# Patient Record
Sex: Male | Born: 1985 | Race: Black or African American | Hispanic: No | Marital: Single | Smoking: Current every day smoker
Health system: Southern US, Community
[De-identification: ages and names within clinical notes are randomized; demographics above are authoritative.]

## PROBLEM LIST (undated history)

## (undated) HISTORY — PX: HERNIA REPAIR: SHX51

---

## 2014-09-10 ENCOUNTER — Ambulatory Visit
Admission: EM | Admit: 2014-09-10 | Discharge: 2014-09-10 | Disposition: A | Discharge status: Home / Self Care | Payer: Self-pay | Attending: Family Medicine | Admitting: Family Medicine

## 2014-09-10 ENCOUNTER — Encounter: Payer: Self-pay | Admitting: Emergency Medicine

## 2014-09-10 DIAGNOSIS — R21 Rash and other nonspecific skin eruption: Secondary | ICD-10-CM

## 2014-09-10 DIAGNOSIS — F17210 Nicotine dependence, cigarettes, uncomplicated: Secondary | ICD-10-CM | POA: Insufficient documentation

## 2014-09-10 DIAGNOSIS — H6593 Unspecified nonsuppurative otitis media, bilateral: Secondary | ICD-10-CM

## 2014-09-10 DIAGNOSIS — J0100 Acute maxillary sinusitis, unspecified: Secondary | ICD-10-CM

## 2014-09-10 DIAGNOSIS — R112 Nausea with vomiting, unspecified: Secondary | ICD-10-CM

## 2014-09-10 DIAGNOSIS — J039 Acute tonsillitis, unspecified: Secondary | ICD-10-CM

## 2014-09-10 DIAGNOSIS — Z79899 Other long term (current) drug therapy: Secondary | ICD-10-CM | POA: Insufficient documentation

## 2014-09-10 LAB — CBC WITH DIFFERENTIAL/PLATELET
Basophils Absolute: 0 10*3/uL (ref 0–0.1)
Basophils Relative: 0 %
Eosinophils Absolute: 0 10*3/uL (ref 0–0.7)
Eosinophils Relative: 0 %
HCT: 41.3 % (ref 40.0–52.0)
Hemoglobin: 14 g/dL (ref 13.0–18.0)
Lymphocytes Relative: 4 %
Lymphs Abs: 0.8 10*3/uL — ABNORMAL LOW (ref 1.0–3.6)
MCH: 32 pg (ref 26.0–34.0)
MCHC: 34 g/dL (ref 32.0–36.0)
MCV: 94.2 fL (ref 80.0–100.0)
Monocytes Absolute: 3 10*3/uL — ABNORMAL HIGH (ref 0.2–1.0)
Monocytes Relative: 14 %
Neutro Abs: 18.5 10*3/uL — ABNORMAL HIGH (ref 1.4–6.5)
Neutrophils Relative %: 82 %
Platelets: 253 10*3/uL (ref 150–440)
RBC: 4.38 MIL/uL — ABNORMAL LOW (ref 4.40–5.90)
RDW: 12.8 % (ref 11.5–14.5)
WBC: 22.4 10*3/uL — ABNORMAL HIGH (ref 3.8–10.6)

## 2014-09-10 LAB — MONONUCLEOSIS SCREEN: Mono Screen: NEGATIVE

## 2014-09-10 LAB — RAPID STREP SCREEN (MED CTR MEBANE ONLY): STREPTOCOCCUS, GROUP A SCREEN (DIRECT): NEGATIVE

## 2014-09-10 MED ORDER — SALINE SPRAY 0.65 % NA SOLN: 2.0000 | NASAL | Status: DC

## 2014-09-10 MED ORDER — PENICILLIN G BENZATHINE 1200000 UNIT/2ML IM SUSP
1.2000 10*6.[IU] | Freq: Once | INTRAMUSCULAR | Status: AC
  Administered 2014-09-10: 1.2 10*6.[IU] via INTRAMUSCULAR

## 2014-09-10 MED ORDER — FLUTICASONE PROPIONATE 50 MCG/ACT NA SUSP: 1.0000 | Freq: Two times a day (BID) | NASAL | Status: DC

## 2014-09-10 MED ORDER — ONDANSETRON 8 MG PO TBDP: 8.0000 mg | ORAL_TABLET | Freq: Two times a day (BID) | ORAL | Status: DC

## 2014-09-10 MED ORDER — ACETAMINOPHEN 500 MG PO TABS: 1000.0000 mg | ORAL_TABLET | Freq: Once | ORAL | Status: AC

## 2014-09-10 MED ORDER — METHYLPREDNISOLONE SODIUM SUCC 125 MG IJ SOLR
125.0000 mg | Freq: Once | INTRAMUSCULAR | Status: AC
  Administered 2014-09-10: 125 mg via INTRAMUSCULAR

## 2014-09-10 MED ORDER — ACETAMINOPHEN 500 MG PO TABS
1000.0000 mg | ORAL_TABLET | Freq: Once | ORAL | Status: AC
  Administered 2014-09-10: 1000 mg via ORAL

## 2014-09-10 MED ORDER — ONDANSETRON 8 MG PO TBDP
8.0000 mg | ORAL_TABLET | Freq: Once | ORAL | Status: AC
  Administered 2014-09-10: 8 mg via ORAL

## 2014-09-10 NOTE — Discharge Instructions (Signed)
Tonsillitis Tonsillitis is an infection of the throat that causes the tonsils to become red, tender, and swollen. Tonsils are collections of lymphoid tissue at the back of the throat. Each tonsil has crevices (crypts). Tonsils help fight nose and throat infections and keep infection from spreading to other parts of the body for the first 18 months of life.  CAUSES Sudden (acute) tonsillitis is usually caused by infection with streptococcal bacteria. Long-lasting (chronic) tonsillitis occurs when the crypts of the tonsils become filled with pieces of food and bacteria, which makes it easy for the tonsils to become repeatedly infected. SYMPTOMS  Symptoms of tonsillitis include:  A sore throat, with possible difficulty swallowing.  White patches on the tonsils. Fever.  Sinusitis Sinusitis is redness, soreness, and inflammation of the paranasal sinuses. Paranasal sinuses are air pockets within the bones of your face (beneath the eyes, the middle of the forehead, or above the eyes). In healthy paranasal sinuses, mucus is able to drain out, and air is able to circulate through them by way of your nose. However, when your paranasal sinuses are inflamed, mucus and air can become trapped. This can allow bacteria and other germs to grow and cause infection. Sinusitis can develop quickly and last only a short time (acute) or continue over a long period (chronic). Sinusitis that lasts for more than 12 weeks is considered chronic.  CAUSES  Causes of sinusitis include:  Allergies.  Structural abnormalities, such as displacement of the cartilage that separates your nostrils (deviated septum), which can decrease the air flow through your nose and sinuses and affect sinus drainage.  Functional abnormalities, such as when the small hairs (cilia) that line your sinuses and help remove mucus do not work properly or are not present. SIGNS AND SYMPTOMS  Symptoms of acute and chronic sinusitis are the same. The  primary symptoms are pain and pressure around the affected sinuses. Other symptoms include:  Upper toothache.  Earache.  Headache.  Bad breath.  Decreased sense of smell and taste.  A cough, which worsens when you are lying flat.  Fatigue.  Fever.  Thick drainage from your nose, which often is green and may contain pus (purulent).  Swelling and warmth over the affected sinuses. DIAGNOSIS  Your health care provider will perform a physical exam. During the exam, your health care provider may:  Look in your nose for signs of abnormal growths in your nostrils (nasal polyps).  Tap over the affected sinus to check for signs of infection.  View the inside of your sinuses (endoscopy) using an imaging device that has a light attached (endoscope). If your health care provider suspects that you have chronic sinusitis, one or more of the following tests may be recommended:  Allergy tests.  Nasal culture. A sample of mucus is taken from your nose, sent to a lab, and screened for bacteria.  Nasal cytology. A sample of mucus is taken from your nose and examined by your health care provider to determine if your sinusitis is related to an allergy. TREATMENT  Most cases of acute sinusitis are related to a viral infection and will resolve on their own within 10 days. Sometimes medicines are prescribed to help relieve symptoms (pain medicine, decongestants, nasal steroid sprays, or saline sprays).  However, for sinusitis related to a bacterial infection, your health care provider will prescribe antibiotic medicines. These are medicines that will help kill the bacteria causing the infection.  Rarely, sinusitis is caused by a fungal infection. In theses cases, your  health care provider will prescribe antifungal medicine. For some cases of chronic sinusitis, surgery is needed. Generally, these are cases in which sinusitis recurs more than 3 times per year, despite other treatments. HOME CARE  INSTRUCTIONS   Drink plenty of water. Water helps thin the mucus so your sinuses can drain more easily.  Use a humidifier.  Inhale steam 3 to 4 times a day (for example, sit in the bathroom with the shower running).  Apply a warm, moist washcloth to your face 3 to 4 times a day, or as directed by your health care provider.  Use saline nasal sprays to help moisten and clean your sinuses.  Take medicines only as directed by your health care provider.  If you were prescribed either an antibiotic or antifungal medicine, finish it all even if you start to feel better. SEEK IMMEDIATE MEDICAL CARE IF:  You have increasing pain or severe headaches.  You have nausea, vomiting, or drowsiness.  You have swelling around your face.  You have vision problems.  You have a stiff neck.  You have difficulty breathing. MAKE SURE YOU:   Understand these instructions.  Will watch your condition.  Will get help right away if you are not doing well or get worse. Document Released: 01/26/2005 Document Revised: 06/12/2013 Document Reviewed: 02/10/2011 [facility] Patient Information 2015 [place], [place]. This information is not intended to replace advice given to you by your health care provider. Make sure you discuss any questions you have with your health care provider.  Tiredness.  New episodes of snoring during sleep, when you did not snore before.  Small, foul-smelling, yellowish-white pieces of material (tonsilloliths) that you occasionally cough up or spit out. The tonsilloliths can also cause you to have bad breath. DIAGNOSIS Tonsillitis can be diagnosed through a physical exam. Diagnosis can be confirmed with the results of lab tests, including a throat culture. TREATMENT  The goals of tonsillitis treatment include the reduction of the severity and duration of symptoms and prevention of associated conditions. Symptoms of tonsillitis can be improved with the use of steroids to reduce the  swelling. Tonsillitis caused by bacteria can be treated with antibiotic medicines. Usually, treatment with antibiotic medicines is started before the cause of the tonsillitis is known. However, if it is determined that the cause is not bacterial, antibiotic medicines will not treat the tonsillitis. If attacks of tonsillitis are severe and frequent, your health care provider may recommend surgery to remove the tonsils (tonsillectomy). HOME CARE INSTRUCTIONS   Rest as much as possible and get plenty of sleep.  Drink plenty of fluids. While the throat is very sore, eat soft foods or liquids, such as sherbet, soups, or instant breakfast drinks.  Eat frozen ice pops.  Gargle with a warm or cold liquid to help soothe the throat. Mix 1/4 teaspoon of salt and 1/4 teaspoon of baking soda in 8 oz of water. SEEK MEDICAL CARE IF:   Large, tender lumps develop in your neck.  A rash develops.  A green, yellow-brown, or bloody substance is coughed up.  You are unable to swallow liquids or food for 24 hours.  You notice that only one of the tonsils is swollen. SEEK IMMEDIATE MEDICAL CARE IF:   You develop any new symptoms such as vomiting, severe headache, stiff neck, chest pain, or trouble breathing or swallowing.  You have severe throat pain along with drooling or voice changes.  You have severe pain, unrelieved with recommended medications.  You are unable to  fully open the mouth.  You develop redness, swelling, or severe pain anywhere in the neck.  You have a fever. MAKE SURE YOU:   Understand these instructions.  Will watch your condition.  Will get help right away if you are not doing well or get worse. Document Released: 11/05/2004 Document Revised: 06/12/2013 Document Reviewed: 07/15/2012 [facility] Patient Information 2015 [place], [place]. This information is not intended to replace advice given to you by your health care provider. Make sure you discuss any questions you have with  your health care provider. Otitis Media With Effusion Otitis media with effusion is the presence of fluid in the middle ear. This is a common problem in children, which often follows ear infections. It may be present for weeks or longer after the infection. Unlike an acute ear infection, otitis media with effusion refers only to fluid behind the ear drum and not infection. Children with repeated ear and sinus infections and allergy problems are the most likely to get otitis media with effusion. CAUSES  The most frequent cause of the fluid buildup is dysfunction of the eustachian tubes. These are the tubes that drain fluid in the ears to the back of the nose (nasopharynx). SYMPTOMS   The main symptom of this condition is hearing loss. As a result, you or your child may:  Listen to the TV at a loud volume.  Not respond to questions.  Ask "what" often when spoken to.  Mistake or confuse one sound or word for another.  There may be a sensation of fullness or pressure but usually not pain. DIAGNOSIS   Your health care provider will diagnose this condition by examining you or your child's ears.  Your health care provider may test the pressure in you or your child's ear with a tympanometer.  A hearing test may be conducted if the problem persists. TREATMENT   Treatment depends on the duration and the effects of the effusion.  Antibiotics, decongestants, nose drops, and cortisone-type drugs (tablets or nasal spray) may not be helpful.  Children with persistent ear effusions may have delayed language or behavioral problems. Children at risk for developmental delays in hearing, learning, and speech may require referral to a specialist earlier than children not at risk.  You or your child's health care provider may suggest a referral to an ear, nose, and throat surgeon for treatment. The following may help restore normal hearing:  Drainage of fluid.  Placement of ear tubes (tympanostomy  tubes).  Removal of adenoids (adenoidectomy). HOME CARE INSTRUCTIONS   Avoid secondhand smoke.  Infants who are breastfed are less likely to have this condition.  Avoid feeding infants while they are lying flat.  Avoid known environmental allergens.  Avoid people who are sick. SEEK MEDICAL CARE IF:   Hearing is not better in 3 months.  Hearing is worse.  Ear pain.  Drainage from the ear.  Dizziness. MAKE SURE YOU:   Understand these instructions.  Will watch your condition.  Will get help right away if you are not doing well or get worse. Document Released: 03/05/2004 Document Revised: 06/12/2013 Document Reviewed: 08/23/2012 [facility] Patient Information 2015 [place], [place]. This information is not intended to replace advice given to you by your health care provider. Make sure you discuss any questions you have with your health care provider. Folliculitis  Folliculitis is redness, soreness, and swelling (inflammation) of the hair follicles. This condition can occur anywhere on the body. People with weakened immune systems, diabetes, or obesity have a  greater risk of getting folliculitis. CAUSES  Bacterial infection. This is the most common cause.  Fungal infection.  Viral infection.  Contact with certain chemicals, especially oils and tars. Long-term folliculitis can result from bacteria that live in the nostrils. The bacteria may trigger multiple outbreaks of folliculitis over time. SYMPTOMS Folliculitis most commonly occurs on the scalp, thighs, legs, back, buttocks, and areas where hair is shaved frequently. An early sign of folliculitis is a small, white or yellow, pus-filled, itchy lesion (pustule). These lesions appear on a red, inflamed follicle. They are usually less than 0.2 inches (5 mm) wide. When there is an infection of the follicle that goes deeper, it becomes a boil or furuncle. A group of closely packed boils creates a larger lesion (carbuncle).  Carbuncles tend to occur in hairy, sweaty areas of the body. DIAGNOSIS  Your caregiver can usually tell what is wrong by doing a physical exam. A sample may be taken from one of the lesions and tested in a lab. This can help determine what is causing your folliculitis. TREATMENT  Treatment may include:  Applying warm compresses to the affected areas.  Taking antibiotic medicines orally or applying them to the skin.  Draining the lesions if they contain a large amount of pus or fluid.  Laser hair removal for cases of long-lasting folliculitis. This helps to prevent regrowth of the hair. HOME CARE INSTRUCTIONS  Apply warm compresses to the affected areas as directed by your caregiver.  If antibiotics are prescribed, take them as directed. Finish them even if you start to feel better.  You may take over-the-counter medicines to relieve itching.  Do not shave irritated skin.  Follow up with your caregiver as directed. SEEK IMMEDIATE MEDICAL CARE IF:   You have increasing redness, swelling, or pain in the affected area.  You have a fever. MAKE SURE YOU:  Understand these instructions.  Will watch your condition.  Will get help right away if you are not doing well or get worse. Document Released: 04/06/2001 Document Revised: 07/28/2011 Document Reviewed: 04/28/2011 [facility] Patient Information 2015 [place], [place]. This information is not intended to replace advice given to you by your health care provider. Make sure you discuss any questions you have with your health care provider. Nausea and Vomiting Nausea is a sick feeling that often comes before throwing up (vomiting). Vomiting is a reflex where stomach contents come out of your mouth. Vomiting can cause severe loss of body fluids (dehydration). Children and elderly adults can become dehydrated quickly, especially if they also have diarrhea. Nausea and vomiting are symptoms of a condition or disease. It is important to find the  cause of your symptoms. CAUSES   Direct irritation of the stomach lining. This irritation can result from increased acid production (gastroesophageal reflux disease), infection, food poisoning, taking certain medicines (such as nonsteroidal anti-inflammatory drugs), alcohol use, or tobacco use.  Signals from the brain.These signals could be caused by a headache, heat exposure, an inner ear disturbance, increased pressure in the brain from injury, infection, a tumor, or a concussion, pain, emotional stimulus, or metabolic problems.  An obstruction in the gastrointestinal tract (bowel obstruction).  Illnesses such as diabetes, hepatitis, gallbladder problems, appendicitis, kidney problems, cancer, sepsis, atypical symptoms of a heart attack, or eating disorders.  Medical treatments such as chemotherapy and radiation.  Receiving medicine that makes you sleep (general anesthetic) during surgery. DIAGNOSIS Your caregiver may ask for tests to be done if the problems do not improve after a  few days. Tests may also be done if symptoms are severe or if the reason for the nausea and vomiting is not clear. Tests may include:  Urine tests.  Blood tests.  Stool tests.  Cultures (to look for evidence of infection).  X-rays or other imaging studies. Test results can help your caregiver make decisions about treatment or the need for additional tests. TREATMENT You need to stay well hydrated. Drink frequently but in small amounts.You may wish to drink water, sports drinks, clear broth, or eat frozen ice pops or gelatin dessert to help stay hydrated.When you eat, eating slowly may help prevent nausea.There are also some antinausea medicines that may help prevent nausea. HOME CARE INSTRUCTIONS   Take all medicine as directed by your caregiver.  If you do not have an appetite, do not force yourself to eat. However, you must continue to drink fluids.  If you have an appetite, eat a normal diet  unless your caregiver tells you differently.  Eat a variety of complex carbohydrates (rice, wheat, potatoes, bread), lean meats, yogurt, fruits, and vegetables.  Avoid high-fat foods because they are more difficult to digest.  Drink enough water and fluids to keep your urine clear or pale yellow.  If you are dehydrated, ask your caregiver for specific rehydration instructions. Signs of dehydration may include:  Severe thirst.  Dry lips and mouth.  Dizziness.  Dark urine.  Decreasing urine frequency and amount.  Confusion.  Rapid breathing or pulse. SEEK IMMEDIATE MEDICAL CARE IF:   You have blood or brown flecks (like coffee grounds) in your vomit.  You have black or bloody stools.  You have a severe headache or stiff neck.  You are confused.  You have severe abdominal pain.  You have chest pain or trouble breathing.  You do not urinate at least once every 8 hours.  You develop cold or clammy skin.  You continue to vomit for longer than 24 to 48 hours.  You have a fever. MAKE SURE YOU:   Understand these instructions.  Will watch your condition.  Will get help right away if you are not doing well or get worse. Document Released: 01/26/2005 Document Revised: 04/20/2011 Document Reviewed: 06/25/2010 [facility] Patient Information 2015 [place], [place]. This information is not intended to replace advice given to you by your health care provider. Make sure you discuss any questions you have with your health care provider.

## 2014-09-10 NOTE — ED Provider Notes (Addendum)
CSN: [id number]     Arrival date & time 09/10/14  1007 History   First MD Initiated Contact with Patient 09/10/14 1042     Chief Complaint  Patient presents with  . Sore Throat   (Consider location/radiation/quality/duration/timing/severity/associated sxs/prior Treatment) HPI Comments: Single caucasian male here with mother recently relocated from [place] working at [employer] denied sick contacts.  Sore throat/cough.  Vomited x 1 in registration today.  Didn't eat breakfast had OTC cough and cold medication liquid red colored this am.  Headache entire head with cough productive white phlegm.  Belly pain with vomiting. Denied diarrhea/constipation/blood in stool.  Has noticed rash on abdomen for months reported blisters dry no itching, body aches, last peed this am yellow.  The history is provided by the patient and a parent.    History reviewed. No pertinent past medical history. Past Surgical History  Procedure Laterality Date  . Hernia repair     History reviewed. No pertinent family history. History  Substance Use Topics  . Smoking status: Current Every Day Smoker -- 0.50 packs/day    Types: Cigarettes  . Smokeless tobacco: Never Used  . Alcohol Use: Yes    Review of Systems  Constitutional: Positive for fever and fatigue. Negative for chills, diaphoresis, activity change and appetite change.  HENT: Positive for congestion and sore throat. Negative for dental problem, drooling, ear discharge, ear pain, facial swelling, hearing loss, mouth sores, nosebleeds, postnasal drip, rhinorrhea, sinus pressure, sneezing, tinnitus, trouble swallowing and voice change.   Eyes: Negative for photophobia, pain, discharge, redness, itching and visual disturbance.  Respiratory: Positive for cough. Negative for choking, shortness of breath and wheezing.   Cardiovascular: Negative for chest pain, palpitations and leg swelling.  Gastrointestinal: Positive for vomiting and  abdominal pain. Negative for nausea, diarrhea, constipation, blood in stool and abdominal distention.  Endocrine: Negative for cold intolerance and heat intolerance.  Genitourinary: Negative for dysuria and hematuria.  Musculoskeletal: Positive for myalgias. Negative for back pain, joint swelling, arthralgias, gait problem, neck pain and neck stiffness.  Skin: Positive for rash. Negative for color change, pallor and wound.  Allergic/Immunologic: Negative for environmental allergies and food allergies.  Neurological: Negative for dizziness, tremors, seizures, syncope, facial asymmetry, speech difficulty, weakness, light-headedness and headaches.  Hematological: Does not bruise/bleed easily.  Psychiatric/Behavioral: Negative for behavioral problems, confusion, sleep disturbance and agitation.    Allergies  Review of patient's allergies indicates no known allergies.  Home Medications   Prior to Admission medications   Medication Sig Start Date End Date Taking? Authorizing Provider  acetaminophen (TYLENOL) 500 MG tablet Take 2 tablets (1,000 mg total) by mouth once. 09/10/14   Barbaraann Barthel, NP  fluticasone (FLONASE) 50 MCG/ACT nasal spray Place 1 spray into both nostrils 2 (two) times daily. 09/10/14   Barbaraann Barthel, NP  ondansetron (ZOFRAN-ODT) 8 MG disintegrating tablet Take 1 tablet (8 mg total) by mouth 2 (two) times daily. 09/10/14   Barbaraann Barthel, NP  sodium chloride (OCEAN) 0.65 % SOLN nasal spray Place 2 sprays into both nostrils every 2 (two) hours while awake. 09/10/14   Jarold Song Betancourt, NP   BP 117/71 mmHg  Pulse 95  Temp(Src) 100.9 F (38.3 C) (Oral)  Resp 18  Ht  (1.727 m)  Wt 130 lb (58.968 kg)  BMI 19.77 kg/m2  SpO2 99% Physical Exam  Constitutional: He is oriented to person, place, and time. Vital signs are normal. He appears well-developed and well-nourished. No  distress.  HENT:  Head: Normocephalic and atraumatic.  Right Ear: Hearing, external ear and ear  canal normal. A middle ear effusion is present.  Left Ear: Hearing, external ear and ear canal normal. A middle ear effusion is present.  Nose: Mucosal edema and rhinorrhea present. No nose lacerations, sinus tenderness, nasal deformity, septal deviation or nasal septal hematoma. No epistaxis.  No foreign bodies. Right sinus exhibits maxillary sinus tenderness. Right sinus exhibits no frontal sinus tenderness. Left sinus exhibits maxillary sinus tenderness. Left sinus exhibits no frontal sinus tenderness.  Mouth/Throat: Uvula is midline and mucous membranes are normal. Mucous membranes are not pale, not dry and not cyanotic. He does not have dentures. No oral lesions. No trismus in the jaw. Abnormal dentition. No dental abscesses, uvula swelling, lacerations or dental caries. Posterior oropharyngeal edema and posterior oropharyngeal erythema present. No oropharyngeal exudate or tonsillar abscesses.  Cracked teeth; exudate bilateral tonsils symmetrical enlarged 2-3+/4 bilaterally with erythema extending to soft and hard palate; cobblestoning posterior pharynx; bilateral TMs with air fluid level no opacity  Eyes: Conjunctivae, EOM and lids are normal. Pupils are equal, round, and reactive to light. Right eye exhibits no discharge. Left eye exhibits no discharge. No scleral icterus.  Neck: Trachea normal, normal range of motion and phonation normal. Neck supple. No tracheal tenderness, no spinous process tenderness and no muscular tenderness present. No rigidity. No tracheal deviation, no edema, no erythema and normal range of motion present.  Cardiovascular: Normal rate, regular rhythm, normal heart sounds and intact distal pulses.  Exam reveals no gallop and no friction rub.   No murmur heard. Pulmonary/Chest: Effort normal. No accessory muscle usage or stridor. No respiratory distress. He has no decreased breath sounds. He has no wheezes. He has no rhonchi. He has no rales. He exhibits no tenderness.    Abdominal: Soft. Bowel sounds are normal. He exhibits no shifting dullness, no distension, no pulsatile liver, no fluid wave, no abdominal bruit, no ascites, no pulsatile midline mass and no mass. There is no hepatosplenomegaly. There is no tenderness. There is no rigidity, no rebound, no guarding, no CVA tenderness, no tenderness at McBurney's point and negative Murphy's sign. Hernia confirmed negative in the ventral area.  Musculoskeletal: He exhibits no edema or tenderness.  Lymphadenopathy:    He has no cervical adenopathy.  Neurological: He is alert and oriented to person, place, and time. He has normal strength. He displays no atrophy and no tremor. No cranial nerve deficit or sensory deficit. He exhibits normal muscle tone. He displays no seizure activity. Coordination and gait normal. GCS eye subscore is 4. GCS verbal subscore is 5. GCS motor subscore is 6.  Skin: Skin is warm, dry and intact. Rash noted. No abrasion, no bruising, no burn, no ecchymosis, no laceration, no lesion, no petechiae and no purpura noted. Rash is papular. Rash is not macular, not maculopapular, not nodular, not pustular, not vesicular and not urticarial. He is not diaphoretic. No cyanosis or erythema. No pallor. Nails show no clubbing.     Fine papular rash abdomen not TTP dry no discoloration e.g. Hyper/hypopigmentation or erythema  Psychiatric: He has a normal mood and affect. His speech is normal and behavior is normal. Judgment and thought content normal. Cognition and memory are normal.  Nursing note and vitals reviewed.   ED Course  Procedures (including critical care time) Labs Review Labs Reviewed  CBC WITH DIFFERENTIAL/PLATELET - Abnormal; Notable for the following:    WBC 22.4 (*)    RBC  4.38 (*)    Neutro Abs 18.5 (*)    Lymphs Abs 0.8 (*)    Monocytes Absolute 3.0 (*)    All other components within normal limits  RAPID STREP SCREEN (NOT AT [facility])  CULTURE, GROUP A STREP ([facility] ONLY)   MONONUCLEOSIS SCREEN   Medications  ondansetron (ZOFRAN-ODT) disintegrating tablet 8 mg (8 mg Oral Given 09/10/14 1120)  methylPREDNISolone sodium succinate (SOLU-MEDROL) 125 mg/2 mL injection 125 mg (125 mg Intramuscular Given 09/10/14 1157)  acetaminophen (TYLENOL) tablet 1,000 mg (1,000 mg Oral Given 09/10/14 1202)  penicillin g benzathine (BICILLIN LA) 1200000 UNIT/31ML injection 1.2 Million Units (1.2 Million Units Intramuscular Given 09/10/14 1213)   Filed Vitals:   09/10/14 1247  BP: 117/71  Pulse: 95  Temp: 100.9 F (38.3 C)  Resp: 18    Imaging Review No results found.  1115 zofran 0DT 8mg  po ordered for patient.  Lying on exam table awaiting rapid strep, CBC and mononucleosis rapid testing.  Discussed differential diagnoses of tonsillar exudate with patient and mother e.g. Viral, bacterial etiologies.  Throat culture for atypical strep.  Signs and symptoms of tonsillar abscess requiring immediate follow up in ER.  Work note x 24 hours given to patient.  Patient prefers IM injections for medications if possible.  Patient and mother verbalized understanding of information/instructions, agreed with plan of care and had no further questions at this time.  1140 rapid strep notified patient and mother solumedrol IM ordered for patient.  Mother and patient verbalized understanding of information, agreed with plan of care and had no further questions at this time.  1215 patient notified mono rapid negative.  Bicillin IM 1.31M units ordered.  Patient also received tylenol 1000mg  po x 1now for worsening fever able to tolerate po fluids after zofran ODT.  Patient verbalized understanding of information and had no further questions at this time.  1235 patient tolerated one cup of water in clinic.  Reiterated if worsening especially asymetrical tonsils, dyspnea, dysphagia follow up ER tonight.  Tylenol helped some with pain.  Mother to drive patient home.  Patient verbalized understanding of  information/instructions, agreed with plan of care and had no further questions at this time. MDM   1. Rash   2. Tonsillitis with exudate   3. Acute maxillary sinusitis, recurrence not specified   4. Otitis media with effusion, bilateral   5. Non-intractable vomiting with nausea, vomiting of unspecified type    Abdominal hair folliculitis suspected as papular non erythematous at base of hair.  May apply OTC hydrocortisone 1% cream sparingly to affected hair follicles. Wash hands thoroughly after application.    Exitcare handout on folliculitis given to patient.  Patient verbalized understanding of information, instructions, agreed with plan of care and had no further questions at this time.  Rapid strep and mono rapid negative. Rest, hydrate, hydrate, hydrate today.  Throat culture pending in 48 hours typically will call once results available discussed with patient to contact clinic Wednesday 3 Aug for results since I will not be working that day and ask to speak with nurse.   Gave bicillin LA and solumedrol IM in clinic today as prefers to oral medications.  School/work excuse note given to patient for 24 hours.  Usually no specific medical treatment is needed if a virus is causing the sore throat.  The throat most often gets better on its own within 5 to 7 days.  Antibiotic medicine does not cure viral pharyngitis.   Rx for tylenol 1000mg  po QID  prn pain.  Discussed if dysphagia, dyspnea, asymmetrical tonsil enlargement to follow up immediately for re-evaluation.  Discussed risk of tonsillar abscess with mother and patient. For acute pharyngitis caused by bacteria, your healthcare provider will prescribe an antibiotic.  Marland Kitchen Do not smoke.  Marland Kitchen Avoid secondhand smoke and other air pollutants.  . Use a cool mist humidifier to add moisture to the air.  . Get plenty of rest.  . You may want to rest your throat by talking less and eating a diet that is mostly liquid or soft for a day or two.    Marland Kitchen Nonprescription throat lozenges and mouthwashes should help relieve the soreness.   . Gargling with warm saltwater and drinking warm liquids may help.  (You can make a saltwater solution by adding 1/4 teaspoon of salt to 8 ounces, or 240 mL, of warm water.)  . A nonprescription pain reliever such as aspirin, acetaminophen, or ibuprofen may ease general aches and pains.   FOLLOW UP with clinic provider if no improvements in the next 7-10 days. exitcare handout on tonsillitis, pharyngitis given to patient.  Patient and mother verbalized understanding of instructions and agreed with plan of care. P2:  Hand washing and diet.  Start flonase 1 spray each nostril BID.  No evidence of systemic bacterial infection, non toxic and well hydrated.  I do not see where any further testing or imaging is necessary at this time.   I will suggest supportive care, rest, good hygiene and encourage the patient to take adequate fluids.  The patient is to return to clinic or EMERGENCY ROOM if symptoms worsen or change significantly.  Exitcare handout on sinusitis given to patient.  Patient verbalized agreement and understanding of treatment plan and had no further questions at this time.   P2:  Hand washing and cover cough  Work/school note given to patient for 24 hours off.  I have recommended clear fluids and advance diet as tolerated soft avoid fried, spicy and dairy until symptoms completely resolve.  Exitcare handout on nausea and vomiting given to patient. zofran 8mg  ODT po BID prn Rx given to patient.  Discussed may try ginger ale.  Return to the clinic if  symptoms persist or worsen; I have alerted the patient to call if high fever, dehydration, marked weakness, fainting, increased abdominal pain, blood in stool or vomit (red or black).  Patient verbalized agreement and understanding of treatment plan and had no further questions at this time.   P2:  Hand washing and fitness Supportive treatment.   No evidence of  invasive bacterial infection, non toxic and well hydrated.  This is most likely self limiting viral infection.  I do not see where any further testing or imaging is necessary at this time.   I will suggest supportive care, rest, good hygiene and encourage the patient to take adequate fluids.  The patient is to return to clinic or EMERGENCY ROOM if symptoms worsen or change significantly e.g. ear pain, fever, purulent discharge from ears or bleeding.  Exitcare handout on otitis media with effusion given to patient.  Patient verbalized agreement and understanding of treatment plan.    Barbaraann Barthel, NP 09/10/14 1858  Telephone message left for patient throat culture Group G strep.  Received bicillin and solumedrol IM in clinic.  Want to verify if symptoms have resolved.  Left message for patient to contact clinic with status update.  Barbaraann Barthel, NP 09/14/14 [phone]

## 2014-09-10 NOTE — ED Notes (Signed)
Patient c/o sore throat, HAs, and vomiting that started 2 days ago.

## 2014-09-10 NOTE — ED Notes (Signed)
Pt provided warm blanket and gingerale.

## 2014-09-11 LAB — CULTURE, GROUP A STREP (THRC)

## 2014-09-15 ENCOUNTER — Ambulatory Visit
Admission: EM | Admit: 2014-09-15 | Discharge: 2014-09-15 | Disposition: A | Discharge status: Home / Self Care | Payer: Self-pay | Attending: Internal Medicine | Admitting: Internal Medicine

## 2014-09-15 DIAGNOSIS — J36 Peritonsillar abscess: Secondary | ICD-10-CM

## 2014-09-15 MED ORDER — CEFTRIAXONE SODIUM 1 G IJ SOLR
1.0000 g | Freq: Once | INTRAMUSCULAR | Status: AC
  Administered 2014-09-15: 1 g via INTRAMUSCULAR

## 2014-09-15 MED ORDER — PENICILLIN V POTASSIUM 500 MG PO TABS: ORAL_TABLET | ORAL | Status: DC

## 2014-09-15 MED ORDER — PREDNISONE 20 MG PO TABS: ORAL_TABLET | ORAL | Status: DC

## 2014-09-15 NOTE — Discharge Instructions (Signed)
The left side of your throat/tonsils is still swollen. This may represent the swelling from the  strep throat is just taking a longer time to go away or it may be that you are developing an abscess in that area. As we all discussed in the exam room - please be aware if you start having trouble swallowing, if you have trouble making your spit swallow, you have increased pain or your fever returns and won't be controlled by tylenol or ibuprofen-   you need to go to the EMERGENCY ROOM of your choice...  The injection you got while here was Rocephin, an antibiotic The prescription I am giving youis additional antibiotic Penicillin VK and the small pills are Prednisone which should help decrease the swelling in the tissue All of these working together should make the next week steadily better.  If things turn worse please pay attention and seek additional care.  We need you to call an ENT ( Ear , Nose, Throat doctor) to schedule a visit for early next week to be re-evalauted- I have given you a business card for one of the doctors upstairs from [place]  Peritonsillar Abscess A peritonsillar abscess is a collection of pus located in the back of the throat behind the tonsils. It usually occurs when a streptococcal infection of the throat or tonsils spreads into the space around the tonsils. They are almost always caused by the streptococcal germ (bacteria). The treatment of a peritonsillar abscess is most often drainage accomplished by putting a needle into the abscess or cutting (incising) and draining the abscess. This is most often followed with a course of antibiotics. HOME CARE INSTRUCTIONS  If your abscess was drained by your caregiver today, rinse your throat (gargle) with warm salt water four times per day or as needed for comfort. Do not swallow this mixture. Mix 1 teaspoon of salt in 8 ounces of warm water for gargling.  Rest in bed as needed. Resume activities as able.  Apply cold to your neck for  pain relief. Fill a plastic bag with ice and wrap it in a towel. Hold the ice on your neck for 20 minutes 4 times per day.  Eat a soft or liquid diet as tolerated while your throat remains sore. Popsicles and ice cream may be good early choices. Drinking plenty of cold fluids will probably be soothing and help take swelling down in between the warm gargles.  Only take over-the-counter or prescription medicines for pain, discomfort, or fever as directed by your caregiver. Do not use aspirin unless directed by your physician. Aspirin slows down the clotting process. It can also cause bleeding from the drainage area if this was needled or incised today.  If antibiotics were prescribed, take them as directed for the full course of the prescription. Even if you feel you are well, you need to take them. SEEK MEDICAL CARE IF:   You have increased pain, swelling, redness, or drainage in your throat.  You develop signs of infection such as dizziness, headache, lethargy, or generalized feelings of illness.  You have difficulty breathing, swallowing or eating.  You show signs of becoming dehydrated (lightheadedness when standing, decreased urine output, a fast heart rate, or dry mouth and mucous membranes). SEEK IMMEDIATE MEDICAL CARE IF:   You have a fever.  You are coughing up or vomiting blood.  You develop more severe throat pain uncontrolled with medicines or you start to drool.  You develop difficulty breathing, talking, or find it easier  to breathe while leaning forward. Document Released: 01/26/2005 Document Revised: 04/20/2011 Document Reviewed: 09/09/2007 [facility] Patient Information 2015 [place], [place]. This information is not intended to replace advice given to you by your health care provider. Make sure you discuss any questions you have with your health care provider.

## 2014-09-15 NOTE — ED Notes (Signed)
Patient with sore throat since last Monday. Was treated but still hurts and feels swollen.

## 2014-09-18 NOTE — ED Provider Notes (Signed)
CSN: [id number]     Arrival date & time 09/15/14  0808 History   First MD Initiated Contact with Patient 09/15/14 [phone]     Chief Complaint  Patient presents with  . Sore Throat   (Consider location/radiation/quality/duration/timing/severity/associated sxs/prior Treatment) HPI  29 yo AAM presents with continued sore throat. Was seen 8/1 with tonsillitis, sinusitis, Bilat OM that screened negative to Strep and Mono. Treated with Bicillin and SoluMedrol. Strep culture final positive for Strep G, patient notified yesterday. Presents today with continued sore throat and aggravating fullness on the left. No difficulty swallowing fluids or solids, no difficulty breathing or swallowing saliva. He takes an occassional Tylenol and uses nasal spray daily History reviewed. No pertinent past medical history. Past Surgical History  Procedure Laterality Date  . Hernia repair     History reviewed. No pertinent family history. History  Substance Use Topics  . Smoking status: Current Every Day Smoker -- 0.50 packs/day    Types: Cigarettes  . Smokeless tobacco: Never Used  . Alcohol Use: Yes    Review of Systems   Constitutional -afebrile Eyes-denies visual changes ENT- normal voice,has sore throat and left sided fullnes but swallows without difficulty CV-denies chest pain Resp-denies SOB GI- negative for nausea,vomiting, diarrhea- GI symptoms have resolved GU- negative for dysuria MSK- negative for back pain, ambulatory Skin- denies acute changes Neuro- negative headache,focal weakness or numbness    Allergies  Review of patient's allergies indicates no known allergies.  Home Medications   Prior to Admission medications   Medication Sig Start Date End Date Taking? Authorizing Provider  acetaminophen (TYLENOL) 500 MG tablet Take 2 tablets (1,000 mg total) by mouth once. 09/10/14   Barbaraann Barthel, NP  fluticasone (FLONASE) 50 MCG/ACT nasal spray Place 1 spray into both nostrils 2 (two)  times daily. 09/10/14   Barbaraann Barthel, NP  ondansetron (ZOFRAN-ODT) 8 MG disintegrating tablet Take 1 tablet (8 mg total) by mouth 2 (two) times daily. 09/10/14   Barbaraann Barthel, NP  penicillin v potassium (VEETID) 500 MG tablet 2 tablets twice daily  x 10 days 09/15/14   Rae Halsted, PA-C  predniSONE (DELTASONE) 20 MG tablet 3 tablets daily ( take all together)  for 5 days 09/15/14   Rae Halsted, PA-C  sodium chloride (OCEAN) 0.65 % SOLN nasal spray Place 2 sprays into both nostrils every 2 (two) hours while awake. 09/10/14   Jarold Song Betancourt, NP   BP 123/75 mmHg  Pulse 85  Temp(Src) 98.8 F (37.1 C) (Tympanic)  Resp 18  Ht 5\' 8"  (1.727 m)  Wt 130 lb (58.968 kg)  BMI 19.77 kg/m2  SpO2 98% Physical Exam   Constitutional -alert and oriented,well appearing and in mild distress sore throat-afebrile Head-atraumatic, normocephalic Eyes- conjunctiva normal, EOMI ,conjugate gaze Nose- no congestion or rhinorrhea Mouth/throat- mucous membranes moist ,oropharynx mild erythema;left tonsil is assymmetrically enlarged and there is suspicion of peritonsillar abscess; no exudate, Fullness significantly less than midline, deeply erythematous,moderate swelling, no difficulty swallowing Neck- supple with bilateral shotty  glandular enlargement, only mildly tender; no posterior nodes, FROM neck without stiffness CV- regular rate, grossly normal heart sounds,  Resp-no distress, normal respiratory effort,clear to auscultation bilaterally GI- soft,non-tender,no distention- rash is no longer present on the abdomen, he is unsure where it was GU-  not examined MSK- non tender, normal ROM, all extremities, ambulatory, self-care Neuro- normal speech and language, no gross focal neurological deficit appreciated, Skin-warm,dry ,intact; no rash noted Psych-mood and affect grossly normal;  speech and behavior grossly normal ED Course  Procedures (including critical care time) Labs Review Labs Reviewed - No data  to display  Imaging Review No results found.  MDM   1. Peritonsillar abscess   2.      Pharyngitis   Consultation with Dr Dayton Scrape. Will treat with Rocephin 1 gm IM here; then PCN VK 1 gm BID for 10 days and Prednisone 60 mg for 5 days po Mother is shown the current throat exam and the conditions that would cause concern are reviewed with her for visual clarity.  Symptoms of concern re swallowing and breathing were discussed with both son and mother and presented as ASAP to ER Diagnosis and treatment discussed.  Questions fielded, expectations and recommendations reviewed. Patient expresses understanding. Will return to [facility] with questions, concern or exacerbation. Return in 5-7 days if sore throat or fullness continues.   Rae Halsted, PA-C 09/18/14 [phone]

## 2016-07-24 DIAGNOSIS — Z8619 Personal history of other infectious and parasitic diseases: Secondary | ICD-10-CM | POA: Insufficient documentation

## 2016-08-08 ENCOUNTER — Encounter: Payer: Self-pay | Admitting: Emergency Medicine

## 2016-08-08 ENCOUNTER — Emergency Department
Admission: EM | Admit: 2016-08-08 | Discharge: 2016-08-08 | Disposition: A | Discharge status: Home / Self Care | Payer: BLUE CROSS/BLUE SHIELD | Attending: Emergency Medicine | Admitting: Emergency Medicine

## 2016-08-08 DIAGNOSIS — R55 Syncope and collapse: Secondary | ICD-10-CM | POA: Insufficient documentation

## 2016-08-08 DIAGNOSIS — F17210 Nicotine dependence, cigarettes, uncomplicated: Secondary | ICD-10-CM | POA: Diagnosis not present

## 2016-08-08 DIAGNOSIS — Z79899 Other long term (current) drug therapy: Secondary | ICD-10-CM | POA: Diagnosis not present

## 2016-08-08 DIAGNOSIS — R42 Dizziness and giddiness: Secondary | ICD-10-CM | POA: Diagnosis present

## 2016-08-08 LAB — COMPREHENSIVE METABOLIC PANEL
ALBUMIN: 4.3 g/dL (ref 3.5–5.0)
ALT: 29 U/L (ref 17–63)
ANION GAP: 7 (ref 5–15)
AST: 36 U/L (ref 15–41)
Alkaline Phosphatase: 65 U/L (ref 38–126)
BUN: 17 mg/dL (ref 6–20)
CO2: 30 mmol/L (ref 22–32)
Calcium: 9.5 mg/dL (ref 8.9–10.3)
Chloride: 100 mmol/L — ABNORMAL LOW (ref 101–111)
Creatinine, Ser: 1.02 mg/dL (ref 0.61–1.24)
GFR calc Af Amer: >60 mL/min (ref >60)
GFR calc non Af Amer: >60 mL/min (ref >60)
Glucose, Bld: 92 mg/dL (ref 65–99)
POTASSIUM: 4.1 mmol/L (ref 3.5–5.1)
Sodium: 137 mmol/L (ref 135–145)
TOTAL PROTEIN: 7.5 g/dL (ref 6.5–8.1)
Total Bilirubin: 0.5 mg/dL (ref 0.3–1.2)

## 2016-08-08 LAB — CBC
HCT: 41.8 % (ref 40.0–52.0)
Hemoglobin: 14.2 g/dL (ref 13.0–18.0)
MCH: 31.1 pg (ref 26.0–34.0)
MCHC: 34.1 g/dL (ref 32.0–36.0)
MCV: 91.3 fL (ref 80.0–100.0)
Platelets: 359 10*3/uL (ref 150–440)
RBC: 4.58 MIL/uL (ref 4.40–5.90)
RDW: 12.7 % (ref 11.5–14.5)
WBC: 8.5 10*3/uL (ref 3.8–10.6)

## 2016-08-08 LAB — TROPONIN I

## 2016-08-08 NOTE — ED Provider Notes (Signed)
[facility] Emergency Department Provider Note ____________________________________________   I have reviewed the triage vital signs and the triage nursing note.  HISTORY  Chief Complaint Dizziness   Historian Patient  HPI Scott Johnston is a 31 y.o. male presenting for nearly passing out prior to arrival.  Police officer had come to his house presenting 1 for arrest. While he was getting his socks/use on and he started feeling lightheaded while sitting down and started sweating. No chest pain. He felt like he might pass out but did not really pass out.  No history of passing out. He feels completely better now.  No headache or confusion altered mental status. No focal weakness or numbness.    History reviewed. No pertinent past medical history.  There are no active problems to display for this patient.   Past Surgical History:  Procedure Laterality Date  . HERNIA REPAIR      Prior to Admission medications   Medication Sig Start Date End Date Taking? Authorizing Provider  acetaminophen (TYLENOL) 500 MG tablet Take 2 tablets (1,000 mg total) by mouth once. 09/10/14   Scott Johnston, Scott Song, NP  fluticasone (FLONASE) 50 MCG/ACT nasal spray Place 1 spray into both nostrils 2 (two) times daily. 09/10/14   Scott Johnston, Scott Song, NP  ondansetron (ZOFRAN-ODT) 8 MG disintegrating tablet Take 1 tablet (8 mg total) by mouth 2 (two) times daily. 09/10/14   Scott Johnston, Scott Song, NP  penicillin v potassium (VEETID) 500 MG tablet 2 tablets twice daily  x 10 days 09/15/14   Scott Halsted, Johnston  predniSONE (DELTASONE) 20 MG tablet 3 tablets daily ( take all together)  for 5 days 09/15/14   Scott Halsted, Johnston  sodium chloride (OCEAN) 0.65 % SOLN nasal spray Place 2 sprays into both nostrils every 2 (two) hours while awake. 09/10/14   Scott Johnston, Scott Song, NP    No Known Allergies  No family history on file.  Social History Social History  Substance Use Topics  . Smoking status:  Current Every Day Smoker    Packs/day: 0.50    Types: Cigarettes  . Smokeless tobacco: Never Used  . Alcohol use Yes    Review of Systems  Constitutional: Negative for fever. Eyes: Negative for visual changes. ENT: Negative for sore throat. Cardiovascular: Negative for chest pain. Respiratory: Negative for shortness of breath. Gastrointestinal: Negative for abdominal pain, vomiting and diarrhea. Genitourinary: Negative for dysuria. Musculoskeletal: Negative for back pain. Skin: Negative for rash. Neurological: Negative for headache.  ____________________________________________   PHYSICAL EXAM:  VITAL SIGNS: ED Triage Vitals  Enc Vitals Group     BP 08/08/16 0915 121/87     Pulse Rate 08/08/16 0915 74     Resp 08/08/16 0915 18     Temp 08/08/16 0915 97.6 F (36.4 C)     Temp Source 08/08/16 0915 Oral     SpO2 08/08/16 0915 99 %     Weight 08/08/16 0917 125 lb (56.7 kg)     Height 08/08/16 0917 5\' 8"  (1.727 m)     Head Circumference --      Peak Flow --      Pain Score --      Pain Loc --      Pain Edu? --      Excl. in GC? --      Constitutional: Alert and oriented. Well appearing and in no distress. HEENT   Head: Normocephalic and atraumatic.      Eyes: Conjunctivae are normal.  Pupils equal and round.       Ears:         Nose: No congestion/rhinnorhea.   Mouth/Throat: Mucous membranes are moist.   Neck: No stridor. Cardiovascular/Chest: Normal rate, regular rhythm.  No murmurs, rubs, or gallops. Respiratory: Normal respiratory effort without tachypnea nor retractions. Breath sounds are clear and equal bilaterally. No wheezes/rales/rhonchi. Gastrointestinal: Soft. No distention, no guarding, no rebound. Nontender.    Genitourinary/rectal:Deferred Musculoskeletal: Nontender with normal range of motion in all extremities. No joint effusions.  No lower extremity tenderness.  No edema. Neurologic:  Normal speech and language. No gross or focal  neurologic deficits are appreciated. Skin:  Skin is warm, dry and intact. No rash noted. Psychiatric: Mood and affect are normal. Speech and behavior are normal. Patient exhibits appropriate insight and judgment.   ____________________________________________  LABS (pertinent positives/negatives)  Labs Reviewed  COMPREHENSIVE METABOLIC PANEL - Abnormal; Notable for the following:       Result Value   Chloride 100 (*)    All other components within normal limits  CBC  TROPONIN I    ____________________________________________    EKG I, Governor Rooksebecca Ted Goodner, MD, the attending physician have personally viewed and interpreted all ECGs.  65 bpm. Likely normal sinus rhythm. Narrow QRS. Left axis deviation. LVH. ____________________________________________  RADIOLOGY All Xrays were viewed by me. Imaging interpreted by Radiologist.  None __________________________________________  PROCEDURES  Procedure(s) performed: None  Critical Care performed: None  ____________________________________________   ED COURSE / ASSESSMENT AND PLAN  Pertinent labs & imaging results that were available during my care of the patient were reviewed by me and considered in my medical decision making (see chart for details).    Mr. Scott Johnston presented for episode of lightheadedness with sweating after being approached by the police warrant for rest.  No chest pain. EKG shows LVH without ischemic findings. Laboratory studies are reassuring from the standpoint of no anemia or elevated white blood cell count Y disturbance.  We discussed vasovagal response with near syncope in the emotionally charged atmosphere.      CONSULTATIONS:   None Patient / Family / Caregiver informed of clinical course, medical decision-making process, and agree with plan.   I discussed return precautions, follow-up instructions, and discharge instructions with patient and/or family.  Discharge Instructions : You were  evaluated for nearly passing out called near syncope. I suspect this was a result of what is called vasovagal. The rest of the exam and evaluation are reassuring in the emergency department today.  Return to the emergency department immediately for any worsening symptoms including passing out, chest pain, heart racing, confusion altered mental status, weakness, numbness, or any other symptoms concerning to you.  ___________________________________________   FINAL CLINICAL IMPRESSION(S) / ED DIAGNOSES   Final diagnoses:  Near syncope              Note: This dictation was prepared with Dragon dictation. Any transcriptional errors that result from this process are unintentional    Governor RooksLord, Kemet Nijjar, MD 08/08/16 [phone]

## 2016-08-08 NOTE — ED Triage Notes (Signed)
During arrest patient felt dizzy and like he would pass out. Brought via EMS. Denies chest pain.

## 2016-08-08 NOTE — Discharge Instructions (Signed)
You were evaluated for nearly passing out called near syncope. I suspect this was a result of what is called vasovagal. The rest of the exam and evaluation are reassuring in the emergency department today.  Return to the emergency department immediately for any worsening symptoms including passing out, chest pain, heart racing, confusion altered mental status, weakness, numbness, or any other symptoms concerning to you.

## 2016-09-29 ENCOUNTER — Encounter: Payer: Self-pay | Admitting: Emergency Medicine

## 2016-09-29 ENCOUNTER — Emergency Department
Admission: EM | Admit: 2016-09-29 | Discharge: 2016-09-30 | Disposition: A | Discharge status: Home / Self Care | Payer: Worker's Compensation | Attending: Emergency Medicine | Admitting: Emergency Medicine

## 2016-09-29 DIAGNOSIS — W273XXA Contact with needle (sewing), initial encounter: Secondary | ICD-10-CM

## 2016-09-29 DIAGNOSIS — F17210 Nicotine dependence, cigarettes, uncomplicated: Secondary | ICD-10-CM | POA: Diagnosis not present

## 2016-09-29 DIAGNOSIS — W460XXA Contact with hypodermic needle, initial encounter: Secondary | ICD-10-CM | POA: Diagnosis not present

## 2016-09-29 DIAGNOSIS — IMO0001 Reserved for inherently not codable concepts without codable children: Secondary | ICD-10-CM

## 2016-09-29 DIAGNOSIS — Z7721 Contact with and (suspected) exposure to potentially hazardous body fluids: Secondary | ICD-10-CM | POA: Diagnosis not present

## 2016-09-29 DIAGNOSIS — Z79899 Other long term (current) drug therapy: Secondary | ICD-10-CM | POA: Diagnosis not present

## 2016-09-29 DIAGNOSIS — S61239A Puncture wound without foreign body of unspecified finger without damage to nail, initial encounter: Secondary | ICD-10-CM

## 2016-09-29 NOTE — ED Notes (Signed)
Performed Workers' comp Urine Drug Screen.AS

## 2016-09-29 NOTE — ED Triage Notes (Addendum)
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

## 2016-09-29 NOTE — ED Notes (Addendum)
Pt was exposed to a needle while at work which broke the skin on his finger on right hand.

## 2016-09-30 LAB — COMPREHENSIVE METABOLIC PANEL
ALT: 17 U/L (ref 17–63)
ANION GAP: 7 (ref 5–15)
AST: 26 U/L (ref 15–41)
Albumin: 4.1 g/dL (ref 3.5–5.0)
Alkaline Phosphatase: 54 U/L (ref 38–126)
BUN: 7 mg/dL (ref 6–20)
CALCIUM: 9.2 mg/dL (ref 8.9–10.3)
CO2: 28 mmol/L (ref 22–32)
Chloride: 103 mmol/L (ref 101–111)
Creatinine, Ser: 0.79 mg/dL (ref 0.61–1.24)
GFR calc non Af Amer: >60 mL/min (ref >60)
Glucose, Bld: 101 mg/dL — ABNORMAL HIGH (ref 65–99)
Potassium: 4 mmol/L (ref 3.5–5.1)
Sodium: 138 mmol/L (ref 135–145)
Total Bilirubin: 0.7 mg/dL (ref 0.3–1.2)
Total Protein: 6.7 g/dL (ref 6.5–8.1)

## 2016-09-30 LAB — CBC WITH DIFFERENTIAL/PLATELET
Basophils Absolute: 0.1 10*3/uL (ref 0–0.1)
Basophils Relative: 1 %
EOS PCT: 1 %
Eosinophils Absolute: 0.1 10*3/uL (ref 0–0.7)
HCT: 40 % (ref 40.0–52.0)
HEMOGLOBIN: 13.7 g/dL (ref 13.0–18.0)
LYMPHS ABS: 2.3 10*3/uL (ref 1.0–3.6)
Lymphocytes Relative: 30 %
MCH: 32 pg (ref 26.0–34.0)
MCHC: 34.3 g/dL (ref 32.0–36.0)
MCV: 93.3 fL (ref 80.0–100.0)
MONOS PCT: 11 %
Monocytes Absolute: 0.9 10*3/uL (ref 0.2–1.0)
NEUTROS PCT: 57 %
Neutro Abs: 4.4 10*3/uL (ref 1.4–6.5)
Platelets: 301 10*3/uL (ref 150–440)
RBC: 4.29 MIL/uL — ABNORMAL LOW (ref 4.40–5.90)
RDW: 13.1 % (ref 11.5–14.5)
WBC: 7.8 10*3/uL (ref 3.8–10.6)

## 2016-09-30 MED ORDER — DOLUTEGRAVIR SODIUM 50 MG PO TABS
50.0000 mg | ORAL_TABLET | Freq: Every day | ORAL | Status: DC
  Filled 2016-09-30: qty 1

## 2016-09-30 MED ORDER — DOLUTEGRAVIR SODIUM 50 MG PO TABS: 50.0000 mg | ORAL_TABLET | Freq: Every day | ORAL | 0 refills | Status: DC

## 2016-09-30 MED ORDER — EMTRICITABINE-TENOFOVIR AF 200-25 MG PO TABS: 1.0000 | ORAL_TABLET | Freq: Every day | ORAL | 0 refills | Status: DC

## 2016-09-30 MED ORDER — EMTRICITABINE-TENOFOVIR AF 200-25 MG PO TABS
1.0000 | ORAL_TABLET | Freq: Every day | ORAL | Status: DC
  Filled 2016-09-30: qty 1

## 2016-09-30 NOTE — ED Provider Notes (Signed)
[facility] Emergency Department Provider Note  ____________________________________________   First MD Initiated Contact with Patient 09/29/16 2347     (approximate)  I have reviewed the triage vital signs and the nursing notes.   HISTORY  Chief Complaint Body Fluid Exposure   HPI Scott Johnston is a 31 y.o. male without any chronic medical conditions with a needle stick to his right index finger to the skin of the right index finger at about 9:05 PM this evening. He works in [employer] and said that a needle was sticking throat bag and stuck his finger. He says that the needle. Thick but did not have any blood on the needle itself. However, he says it did draw blood from into his finger. He washed this with his finger and then covered it with antibiotic ointment and a Band-Aid. He denies any medical history.   History reviewed. No pertinent past medical history.  There are no active problems to display for this patient.   Past Surgical History:  Procedure Laterality Date  . HERNIA REPAIR      Prior to Admission medications   Medication Sig Start Date End Date Taking? Authorizing Provider  acetaminophen (TYLENOL) 500 MG tablet Take 2 tablets (1,000 mg total) by mouth once. 09/10/14   Betancourt, Jarold Song, NP  fluticasone (FLONASE) 50 MCG/ACT nasal spray Place 1 spray into both nostrils 2 (two) times daily. 09/10/14   Betancourt, Jarold Song, NP  ondansetron (ZOFRAN-ODT) 8 MG disintegrating tablet Take 1 tablet (8 mg total) by mouth 2 (two) times daily. 09/10/14   Betancourt, Jarold Song, NP  penicillin v potassium (VEETID) 500 MG tablet 2 tablets twice daily  x 10 days 09/15/14   Rae Halsted, PA-C  predniSONE (DELTASONE) 20 MG tablet 3 tablets daily ( take all together)  for 5 days 09/15/14   Rae Halsted, PA-C  sodium chloride (OCEAN) 0.65 % SOLN nasal spray Place 2 sprays into both nostrils every 2 (two) hours while awake. 09/10/14   Betancourt,  Jarold Song, NP    Allergies Patient has no known allergies.  No family history on file.  Social History Social History  Substance Use Topics  . Smoking status: Current Every Day Smoker    Packs/day: 0.50    Types: Cigarettes  . Smokeless tobacco: Never Used  . Alcohol use Yes    Review of Systems  Constitutional: No fever/chills Eyes: No visual changes. ENT: No sore throat. Cardiovascular: Denies chest pain. Respiratory: Denies shortness of breath. Gastrointestinal: No abdominal pain.  No nausea, no vomiting.  No diarrhea.  No constipation. Genitourinary: Negative for dysuria. Musculoskeletal: Negative for back pain. Skin: Negative for rash. Neurological: Negative for headaches, focal weakness or numbness.   ____________________________________________   PHYSICAL EXAM:  VITAL SIGNS: ED Triage Vitals [09/29/16 2255]  Enc Vitals Group     BP 121/85     Pulse Rate 91     Resp 20     Temp 98.2 F (36.8 C)     Temp Source Oral     SpO2 100 %     Weight 130 lb (59 kg)     Height 5\' 8"  (1.727 m)     Head Circumference      Peak Flow      Pain Score      Pain Loc      Pain Edu?      Excl. in GC?     Constitutional: Alert and oriented.  Well appearing and in no acute distress. Eyes: Conjunctivae are normal.  Head: Atraumatic. Nose: No congestion/rhinnorhea. Mouth/Throat: Mucous membranes are moist.  Neck: No stridor.   Cardiovascular: Normal rate, regular rhythm. Grossly normal heart sounds.   Respiratory: Normal respiratory effort.  No retractions. Lungs CTAB. Gastrointestinal: Soft and nontender. No distention. No CVA tenderness. Musculoskeletal: No lower extremity tenderness nor edema.  No joint effusions. Neurologic:  Normal speech and language. No gross focal neurologic deficits are appreciated. Skin:  Skin is warm, dry and intact. No rash noted.  Right index finger with pinpoint entry lesion to the tuft just lateral to the midline. There is no active  bleeding. There is no erythema or pus. No tenderness to palpation. Brisk capillary refill.  Psychiatric: Mood and affect are normal. Speech and behavior are normal.  ____________________________________________   LABS (all labs ordered are listed, but only abnormal results are displayed)  Labs Reviewed  CBC WITH DIFFERENTIAL/PLATELET  COMPREHENSIVE METABOLIC PANEL  URINALYSIS, COMPLETE (UACMP) WITH MICROSCOPIC   ____________________________________________  EKG   ____________________________________________  RADIOLOGY   ____________________________________________   PROCEDURES  Procedure(s) performed:   Procedures  Critical Care performed:   ____________________________________________   INITIAL IMPRESSION / ASSESSMENT AND PLAN / ED COURSE  Pertinent labs & imaging results that were available during my care of the patient were reviewed by me and considered in my medical decision making (see chart for details).  I counseled the patient extensively about the risks of postexposure prophylaxis. Because the source patient is unknown, the needle true blood and the patient says it was a large bore appearing needle we will proceed with postexposure prophylaxis. The patient is understanding of the risks and benefits. We discussed abstaining from sexual activity as well and the follow-up scheduled for 2 weeks, 6 week 3 month, as well as six-month postexposure follow-up testing. He'll be following up with his employer for further guidance about where follow-up. He is understanding of the plan of going to comply.      ____________________________________________   FINAL CLINICAL IMPRESSION(S) / ED DIAGNOSES  Occupational needle stick exposure.    NEW MEDICATIONS STARTED DURING THIS VISIT:  New Prescriptions   No medications on file     Note:  This document was prepared using Dragon voice recognition software and may include unintentional dictation errors.       Myrna Blazer, MD 09/30/16 [phone]

## 2017-12-09 ENCOUNTER — Encounter: Payer: Self-pay | Admitting: Emergency Medicine

## 2017-12-09 ENCOUNTER — Other Ambulatory Visit: Payer: Self-pay

## 2017-12-09 ENCOUNTER — Emergency Department
Admission: EM | Admit: 2017-12-09 | Discharge: 2017-12-09 | Disposition: A | Discharge status: Home / Self Care | Payer: BLUE CROSS/BLUE SHIELD | Attending: Student in an Organized Health Care Education/Training Program | Admitting: Student in an Organized Health Care Education/Training Program

## 2017-12-09 DIAGNOSIS — F17210 Nicotine dependence, cigarettes, uncomplicated: Secondary | ICD-10-CM | POA: Insufficient documentation

## 2017-12-09 DIAGNOSIS — L738 Other specified follicular disorders: Secondary | ICD-10-CM

## 2017-12-09 DIAGNOSIS — G501 Atypical facial pain: Secondary | ICD-10-CM | POA: Diagnosis present

## 2017-12-09 DIAGNOSIS — R220 Localized swelling, mass and lump, head: Secondary | ICD-10-CM | POA: Insufficient documentation

## 2017-12-09 MED ORDER — SULFAMETHOXAZOLE-TRIMETHOPRIM 800-160 MG PO TABS: 1.0000 | ORAL_TABLET | Freq: Two times a day (BID) | ORAL | 0 refills | Status: DC

## 2017-12-09 MED ORDER — OXYCODONE-ACETAMINOPHEN 7.5-325 MG PO TABS: 1.0000 | ORAL_TABLET | Freq: Four times a day (QID) | ORAL | 0 refills | Status: DC | PRN

## 2017-12-09 NOTE — Discharge Instructions (Addendum)
Follow discharge care instructions and take medication as directed. 

## 2017-12-09 NOTE — ED Triage Notes (Signed)
R lower facial pain began 3 days ago.

## 2017-12-09 NOTE — ED Provider Notes (Signed)
[facility] Emergency Department Provider Note   ____________________________________________   First MD Initiated Contact with Patient 12/09/17 1357     (approximate)  I have reviewed the triage vital signs and the nursing notes.   HISTORY  Chief Complaint Dental Pain    HPI Scott Johnston is a 32 y.o. male patient presents with right lateral facial pain for 3 days.  Patient also states there is swelling in the area.  Patient denies dental issues.  Patient denies fever or discharge.  Patient rates his pain as a 5/10.  Patient described the pain is "aching".  No pelvis measured for complaint.  Patient has a full beard.  History reviewed. No pertinent past medical history.  There are no active problems to display for this patient.   Past Surgical History:  Procedure Laterality Date  . HERNIA REPAIR      Prior to Admission medications   Medication Sig Start Date End Date Taking? Authorizing Provider  acetaminophen (TYLENOL) 500 MG tablet Take 2 tablets (1,000 mg total) by mouth once. 09/10/14   Betancourt, Jarold Song, NP  dolutegravir (TIVICAY) 50 MG tablet Take 1 tablet (50 mg total) by mouth daily. 09/30/16   Myrna Blazer, MD  emtricitabine-tenofovir AF (DESCOVY) 200-25 MG tablet Take 1 tablet by mouth daily. 09/30/16   Myrna Blazer, MD  fluticasone (FLONASE) 50 MCG/ACT nasal spray Place 1 spray into both nostrils 2 (two) times daily. 09/10/14   Betancourt, Jarold Song, NP  ondansetron (ZOFRAN-ODT) 8 MG disintegrating tablet Take 1 tablet (8 mg total) by mouth 2 (two) times daily. 09/10/14   Betancourt, Jarold Song, NP  oxyCODONE-acetaminophen (PERCOCET) 7.5-325 MG tablet Take 1 tablet by mouth every 6 (six) hours as needed. 12/09/17   Joni Reining, PA-C  penicillin v potassium (VEETID) 500 MG tablet 2 tablets twice daily  x 10 days 09/15/14   Rae Halsted, PA-C  predniSONE (DELTASONE) 20 MG tablet 3 tablets daily ( take all together)  for 5 days  09/15/14   Rae Halsted, PA-C  sodium chloride (OCEAN) 0.65 % SOLN nasal spray Place 2 sprays into both nostrils every 2 (two) hours while awake. 09/10/14   Betancourt, Jarold Song, NP  sulfamethoxazole-trimethoprim (BACTRIM DS,SEPTRA DS) 800-160 MG tablet Take 1 tablet by mouth 2 (two) times daily. 12/09/17   Joni Reining, PA-C    Allergies Patient has no known allergies.  No family history on file.  Social History Social History   Tobacco Use  . Smoking status: Current Every Day Smoker    Packs/day: 0.50    Types: Cigarettes  . Smokeless tobacco: Never Used  Substance Use Topics  . Alcohol use: Yes  . Drug use: Not on file    Review of Systems Constitutional: No fever/chills Eyes: No visual changes. ENT: No sore throat. Cardiovascular: Denies chest pain. Respiratory: Denies shortness of breath. Gastrointestinal: No abdominal pain.  No nausea, no vomiting.  No diarrhea.  No constipation. Genitourinary: Negative for dysuria. Musculoskeletal: Negative for back pain. Skin: Negative for rash.  Right lateral mandible edema. Neurological: Negative for headaches, focal weakness or numbness.   ____________________________________________   PHYSICAL EXAM:  VITAL SIGNS: ED Triage Vitals  Enc Vitals Group     BP 12/09/17 1347 137/87     Pulse Rate 12/09/17 1347 86     Resp 12/09/17 1347 18     Temp 12/09/17 1347 98.6 F (37 C)     Temp Source 12/09/17 1347 Oral  SpO2 12/09/17 1347 97 %     Weight 12/09/17 1349 130 lb (59 kg)     Height 12/09/17 1349 5\' 8"  (1.727 m)     Head Circumference --      Peak Flow --      Pain Score 12/09/17 1349 5     Pain Loc --      Pain Edu? --      Excl. in GC? --     Constitutional: Alert and oriented. Well appearing and in no acute distress. Mouth/Throat: Mucous membranes are moist.  Oropharynx non-erythematous. Neck: No stridor.  No cervical spine tenderness to palpation. Hematological/Lymphatic/Immunilogical: No cervical  lymphadenopathy. Cardiovascular: Normal rate, regular rhythm. Grossly normal heart sounds.  Good peripheral circulation. Respiratory: Normal respiratory effort.  No retractions. Lungs CTAB. Skin:  Skin is warm, dry and intact. No rash noted.  Edema erythema right lateral mandible area.  Palpable nodule lesion. Psychiatric: Mood and affect are normal. Speech and behavior are normal.  ____________________________________________   LABS (all labs ordered are listed, but only abnormal results are displayed)  Labs Reviewed - No data to display ____________________________________________  EKG   ____________________________________________  RADIOLOGY  ED MD interpretation:    Official radiology report(s): No results found.  ____________________________________________   PROCEDURES  Procedure(s) performed: None  Procedures  Critical Care performed: No  ____________________________________________   INITIAL IMPRESSION / ASSESSMENT AND PLAN / ED COURSE  As part of my medical decision making, I reviewed the following data within the electronic MEDICAL RECORD NUMBER    Right facial edema secondary to folliculitis.  Patient given discharge care instruction advised take medication as directed.  Patient advised to follow-up with [facility] if no improvement in 3 to 5 days.      ____________________________________________   FINAL CLINICAL IMPRESSION(S) / ED DIAGNOSES  Final diagnoses:  Folliculitis barbae     ED Discharge Orders         Ordered    sulfamethoxazole-trimethoprim (BACTRIM DS,SEPTRA DS) 800-160 MG tablet  2 times daily     12/09/17 1415    oxyCODONE-acetaminophen (PERCOCET) 7.5-325 MG tablet  Every 6 hours PRN     12/09/17 1415           Note:  This document was prepared using Dragon voice recognition software and may include unintentional dictation errors.    Joni Reining, PA-C 12/09/17 1423    Willy Eddy, MD 12/09/17  [phone]

## 2018-02-20 ENCOUNTER — Other Ambulatory Visit: Payer: Self-pay

## 2018-02-20 ENCOUNTER — Emergency Department
Admission: EM | Admit: 2018-02-20 | Discharge: 2018-02-20 | Disposition: A | Discharge status: Home / Self Care | Payer: BLUE CROSS/BLUE SHIELD | Attending: Emergency Medicine | Admitting: Emergency Medicine

## 2018-02-20 DIAGNOSIS — F17210 Nicotine dependence, cigarettes, uncomplicated: Secondary | ICD-10-CM | POA: Diagnosis not present

## 2018-02-20 DIAGNOSIS — K047 Periapical abscess without sinus: Secondary | ICD-10-CM | POA: Insufficient documentation

## 2018-02-20 DIAGNOSIS — R51 Headache: Secondary | ICD-10-CM | POA: Diagnosis present

## 2018-02-20 DIAGNOSIS — Z79899 Other long term (current) drug therapy: Secondary | ICD-10-CM | POA: Insufficient documentation

## 2018-02-20 MED ORDER — AMOXICILLIN 500 MG PO TABS: 500.0000 mg | ORAL_TABLET | Freq: Three times a day (TID) | ORAL | 0 refills | Status: DC

## 2018-02-20 NOTE — ED Triage Notes (Signed)
Pt reports that in December he was here for same complaint and given atb - he has abscess to right lower jaw region again

## 2018-02-20 NOTE — ED Notes (Signed)
See triage note  Presents with possible abscess area to jaw

## 2018-02-20 NOTE — ED Provider Notes (Signed)
[facility] Emergency Department Provider Note ____________________________________________  Time seen: Approximately 4:45 PM  I have reviewed the triage vital signs and the nursing notes.   HISTORY  Chief Complaint Abscess   HPI Scott Johnston is a 33 y.o. male who presents to the emergency department for treatment and evaluation of right-sided facial pain and swelling.  He has a recurrent dental abscess.  He has not been to the dentist.  Swelling and pain started again yesterday and has progressed throughout the day.  He took some ibuprofen with some relief of pain but states that the swelling is increasing.   History reviewed. No pertinent past medical history.  There are no active problems to display for this patient.   Past Surgical History:  Procedure Laterality Date  . HERNIA REPAIR      Prior to Admission medications   Medication Sig Start Date End Date Taking? Authorizing Provider  acetaminophen (TYLENOL) 500 MG tablet Take 2 tablets (1,000 mg total) by mouth once. 09/10/14   Betancourt, Scott Songina A, NP  amoxicillin (AMOXIL) 500 MG tablet Take 1 tablet (500 mg total) by mouth 3 (three) times daily. 02/20/18   Francina Beery B, FNP  dolutegravir (TIVICAY) 50 MG tablet Take 1 tablet (50 mg total) by mouth daily. 09/30/16   Myrna BlazerSchaevitz, David Matthew, MD  emtricitabine-tenofovir AF (DESCOVY) 200-25 MG tablet Take 1 tablet by mouth daily. 09/30/16   Myrna BlazerSchaevitz, David Matthew, MD  fluticasone (FLONASE) 50 MCG/ACT nasal spray Place 1 spray into both nostrils 2 (two) times daily. 09/10/14   Betancourt, Scott Songina A, NP  ondansetron (ZOFRAN-ODT) 8 MG disintegrating tablet Take 1 tablet (8 mg total) by mouth 2 (two) times daily. 09/10/14   Betancourt, Scott Songina A, NP  penicillin v potassium (VEETID) 500 MG tablet 2 tablets twice daily  x 10 days 09/15/14   Rae HalstedLee, Laurie W, PA-C  sodium chloride (OCEAN) 0.65 % SOLN nasal spray Place 2 sprays into both nostrils every 2 (two) hours while awake.  09/10/14   Betancourt, Scott Songina A, NP    Allergies Patient has no known allergies.  No family history on file.  Social History Social History   Tobacco Use  . Smoking status: Current Every Day Smoker    Packs/day: 0.50    Types: Cigarettes  . Smokeless tobacco: Never Used  Substance Use Topics  . Alcohol use: Yes    Comment: occ  . Drug use: Never    Review of Systems Constitutional: Negative for fever or recent illness. ENT: Positive for dental pain. Musculoskeletal: Negative for trismus of the jaw.  Skin: Negative for wound or lesion. ____________________________________________   PHYSICAL EXAM:  VITAL SIGNS: ED Triage Vitals  Enc Vitals Group     BP 02/20/18 1640 121/78     Pulse Rate 02/20/18 1640 88     Resp 02/20/18 1640 14     Temp 02/20/18 1640 98.2 F (36.8 C)     Temp Source 02/20/18 1640 Oral     SpO2 02/20/18 1640 99 %     Weight 02/20/18 1639 130 lb (59 kg)     Height 02/20/18 1639 5\' 8"  (1.727 m)     Head Circumference --      Peak Flow --      Pain Score 02/20/18 1638 6     Pain Loc --      Pain Edu? --      Excl. in GC? --     Constitutional: Alert and oriented. Well appearing and in  no acute distress. Eyes: Conjunctiva are clear without discharge or drainage. Mouth/Throat: Airway is patent Periodontal Exam    Hematological/Lymphatic/Immunilogical: No palpable adenopathy in the cervical chain Respiratory: Respirations even and unlabored. Musculoskeletal: Full ROM of the jaw. Neurologic: Awake, alert, oriented.  Skin: Right side facial swelling over the mandible Psychiatric: Affect and behavior intact.  ____________________________________________   LABS (all labs ordered are listed, but only abnormal results are displayed)  Labs Reviewed - No data to display ____________________________________________   RADIOLOGY  Not indicated. ____________________________________________   PROCEDURES  Procedure(s) performed:    Procedures  Critical Care performed: No ____________________________________________   INITIAL IMPRESSION / ASSESSMENT AND PLAN / ED COURSE  [name] is a 33 y.o. male who presents for treatment and evaluation of facial swelling which is most consistent with a dental abscess.  Patient states that he does have dental insurance and was strongly encouraged to call and schedule an appointment.  He will be placed on antibiotics and encouraged to continue taking his ibuprofen for pain.  He was advised to return to the emergency department for symptoms of change or worsen if he is unable to schedule an appointment with his dentist.  Pertinent labs & imaging results that were available during my care of the patient were reviewed by me and considered in my medical decision making (see chart for details).  ____________________________________________   FINAL CLINICAL IMPRESSION(S) / ED DIAGNOSES  Final diagnoses:  Dental abscess    Discharge Medication List as of 02/20/2018  5:41 PM    START taking these medications   Details  amoxicillin (AMOXIL) 500 MG tablet Take 1 tablet (500 mg total) by mouth 3 (three) times daily., Starting Sun 02/20/2018, Normal        If controlled substance prescribed during this visit, 12 month history viewed on the [place] prior to issuing an initial prescription for Schedule II or III opiod.  Note:  This document was prepared using Dragon voice recognition software and may include unintentional dictation errors.    Chinita Pester, FNP 02/20/18 2248    Sharman Cheek, MD 02/20/18 [phone]

## 2018-11-27 ENCOUNTER — Emergency Department
Admission: EM | Admit: 2018-11-27 | Discharge: 2018-11-27 | Disposition: A | Discharge status: Home / Self Care | Payer: BC Managed Care – PPO | Attending: Emergency Medicine | Admitting: Emergency Medicine

## 2018-11-27 ENCOUNTER — Encounter: Payer: Self-pay | Admitting: Emergency Medicine

## 2018-11-27 ENCOUNTER — Other Ambulatory Visit: Payer: Self-pay

## 2018-11-27 DIAGNOSIS — K0889 Other specified disorders of teeth and supporting structures: Secondary | ICD-10-CM | POA: Diagnosis present

## 2018-11-27 DIAGNOSIS — F17210 Nicotine dependence, cigarettes, uncomplicated: Secondary | ICD-10-CM | POA: Insufficient documentation

## 2018-11-27 DIAGNOSIS — K047 Periapical abscess without sinus: Secondary | ICD-10-CM | POA: Diagnosis not present

## 2018-11-27 MED ORDER — LIDOCAINE HCL (PF) 1 % IJ SOLN
5.0000 mL | Freq: Once | INTRAMUSCULAR | Status: AC
  Administered 2018-11-27: 08:00:00 5 mL via INTRADERMAL
  Filled 2018-11-27: qty 5

## 2018-11-27 MED ORDER — CLINDAMYCIN HCL 300 MG PO CAPS: 300.0000 mg | ORAL_CAPSULE | Freq: Three times a day (TID) | ORAL | 0 refills | Status: AC

## 2018-11-27 MED ORDER — CLINDAMYCIN HCL 150 MG PO CAPS
300.0000 mg | ORAL_CAPSULE | Freq: Once | ORAL | Status: AC
  Administered 2018-11-27: 300 mg via ORAL
  Filled 2018-11-27: qty 2

## 2018-11-27 NOTE — ED Triage Notes (Signed)
Pt reports toothache pain to bottom left side of his mouth x 1 week; has had recurrent issues with same tooth; unable to see he says due to [place]; has recently finished antibiotics for abscess to area but the pain and swelling has flared up again; pt in no acute distress

## 2018-11-27 NOTE — Discharge Instructions (Signed)
OPTIONS FOR DENTAL FOLLOW UP CARE ° °[place] Department of Health and Human Services - Local Safety Net Dental Clinics °http://www.ncdhhs.gov/dph/oralhealth/services/safetynetclinics.htm °  °[facility] (336-562-3123) ° °[facility] (919-933-9087) ° °[facility] ([phone] ext [phone]) ° °[facility] ([phone]) ° °[facility] ([phone]) °This clinic caters to the indigent population and is on a lottery system. °Location: °[facility], [address], [place] °Clinic Hours: °Wednesdays from 6pm - 9pm, patients seen by a lottery system. °For dates, call or go to [email or web address].[facility].edu/[facility]/patients/Dental-[facility] °Services: °Cleanings, fillings and simple extractions. °Payment Options: °DENTAL WORK IS FREE OF CHARGE. Bring proof of income or support. °Best way to get seen: °Arrive at 5:15 pm - this is a lottery, NOT first come/first serve, so arriving earlier will not increase your chances of being seen. °  °  °[facility] °[phone] °Select option 1 for emergencies °  °Location: °[facility], [address], [place] °Clinic Hours: °No walk-ins accepted - call the day before to schedule an appointment. °Check in times are 9:30 am and 1:30 pm. °Services: °Simple extractions, temporary fillings, pulpectomy/pulp debridement, uncomplicated abscess drainage. °Payment Options: °PAYMENT IS DUE AT THE TIME OF SERVICE.  Fee is usually $100-200, additional surgical procedures (e.g. abscess drainage) may be extra. °Cash, checks, Visa/MasterCard accepted.  Can file Medicaid if patient is covered for dental - patient should call case worker to check. °No discount for [facility] patients. °Best way to get seen: °MUST call the day before and get onto the schedule. Can usually be seen the next 1-2 days. No walk-ins accepted. °  °  °[facility] °[phone] °   °Location: °[facility], [address], [place] °Clinic Hours: °M, W, Th, F 8am or 1:30pm, Tues 9a or 1:30 - first come/first served. °Services: °Simple extractions, temporary fillings, uncomplicated abscess drainage.  You do not need to be an [place] resident. °Payment Options: °PAYMENT IS DUE AT THE TIME OF SERVICE. °Dental insurance, otherwise sliding scale - bring proof of income or support. °Depending on income and treatment needed, cost is usually $50-200. °Best way to get seen: °Arrive early as it is first come/first served. °  °  °[facility] °[phone] °  °Location: °[address] °Clinic Hours: °Mon-Thu 8a-5p °Services: °Most basic dental services including extractions and fillings. °Payment Options: °PAYMENT IS DUE AT THE TIME OF SERVICE. °Sliding scale, up to 50% off - bring proof if income or support. °Medicaid with dental option accepted. °Best way to get seen: °Call to schedule an appointment, can usually be seen within 2 weeks OR they will try to see walk-ins - show up at 8a or 2p (you may have to wait). °  °  °[facility] °919-245-2435 °[place] RESIDENTS ONLY °  °Location: °[facility], [address], [place], [place] [address] °Clinic Hours: By appointment only. °Monday - Thursday 8am-5pm, Friday 8am-12pm °Services: Cleanings, fillings, extractions. °Payment Options: °PAYMENT IS DUE AT THE TIME OF SERVICE. °Cash, Visa or MasterCard. Sliding scale - $30 minimum per service. °Best way to get seen: °Come in to office, complete packet and make an appointment - need proof of income °or support monies for each household member and proof of [place] residence. °Usually takes about a month to get in. °  °  °[facility] °[phone] °  °Location: °[address]   [place] °Clinic Hours: Walk-in Urgent Care Dental Services are offered Monday-Friday  mornings only. °The numbers of emergencies accepted daily is limited to the number of °providers available. °Maximum 15 - Mondays, Wednesdays & Thursdays °Maximum 10 - Tuesdays & Fridays °Services: °You do not need to be a [place] resident to be seen for a dental emergency. °Emergencies are defined as pain, swelling, abnormal bleeding, or dental trauma. Walkins will receive x-rays if needed. °NOTE: Dental cleaning is not an emergency. °Payment Options: °PAYMENT IS DUE AT THE TIME OF SERVICE. °Minimum co-pay is $40.00 for uninsured patients. °Minimum co-pay is $3.00 for Medicaid with dental coverage. °Dental Insurance is accepted and must be presented at time of visit. °Medicare does not cover dental. °Forms of payment: Cash, credit card, checks. °Best way to get seen: °If not previously registered with the clinic, walk-in dental registration begins at 7:15 am and is on a first come/first serve basis. °If previously registered with the clinic, call to make an appointment. °  °  °[facility] °[phone] °[place] RESIDENTS ONLY °  °Location: °[address], [place], [place] °Clinic Hours: °Mon-Thu 10a-2p °Services: Extractions only! °Payment Options: °FREE (donations accepted) - bring proof of income or support °Best way to get seen: °Call and schedule an appointment OR come at 8am on the 1st Monday of every month (except for holidays) when it is first come/first served. °  °  °[facility] °[phone] °  °Location: °[address], [place] °Clinic Hours: °Friday mornings °Services, Payment Options, Best way to get seen: °Call for info °

## 2018-11-27 NOTE — ED Notes (Signed)
See triage note  Presents with possible dental abscess   States hx of same  Now has increased swelling to right lower jaw line

## 2018-11-27 NOTE — ED Provider Notes (Signed)
[facility] Emergency Department Provider Note  ____________________________________________  Time seen: Approximately 7:17 AM  I have reviewed the triage vital signs and the nursing notes.   HISTORY  Chief Complaint Dental Pain    HPI Scott Johnston is a 33 y.o. male that presents to the emergency department for evaluation of pain and swelling to right bottom jaw for 2 days.  Patient states that he had a dental abscess back before coronavirus and was supposed to have his tooth pulled but was unable to have procedure due to the coronavirus.  Patient at this time was started on amoxicillin, and symptoms resolved.  Few days ago the entire right side of his jaw started swelling and swelling improved but has now left a "knot." No dental pain.  No fevers.  History reviewed. No pertinent past medical history.  There are no active problems to display for this patient.   Past Surgical History:  Procedure Laterality Date  . HERNIA REPAIR      Prior to Admission medications   Medication Sig Start Date End Date Taking? Authorizing Provider  acetaminophen (TYLENOL) 500 MG tablet Take 2 tablets (1,000 mg total) by mouth once. 09/10/14  Yes Betancourt, Jarold Song, NP  clindamycin (CLEOCIN) 300 MG capsule Take 1 capsule (300 mg total) by mouth 3 (three) times daily for 10 days. 11/27/18 12/07/18  Enid Derry, PA-C    Allergies Patient has no known allergies.  History reviewed. No pertinent family history.  Social History Social History   Tobacco Use  . Smoking status: Current Every Day Smoker    Packs/day: 0.50    Types: Cigarettes  . Smokeless tobacco: Never Used  Substance Use Topics  . Alcohol use: Yes    Comment: occ  . Drug use: Never     Review of Systems  Constitutional: No fever/chills Respiratory: No SOB. Gastrointestinal: No nausea, no vomiting.  Musculoskeletal: Negative for musculoskeletal pain. Skin: Negative for rash, abrasions, lacerations,  ecchymosis. Neurological: Negative for headaches   ____________________________________________   PHYSICAL EXAM:  VITAL SIGNS: ED Triage Vitals  Enc Vitals Group     BP 11/27/18 0639 (!) 136/91     Pulse Rate 11/27/18 0639 84     Resp 11/27/18 0639 16     Temp 11/27/18 0639 97.9 F (36.6 C)     Temp Source 11/27/18 0639 Oral     SpO2 11/27/18 0639 100 %     Weight 11/27/18 0640 131 lb (59.4 kg)     Height 11/27/18 0640 5\' 8"  (1.727 m)     Head Circumference --      Peak Flow --      Pain Score 11/27/18 0640 2     Pain Loc --      Pain Edu? --      Excl. in GC? --      Constitutional: Alert and oriented. Well appearing and in no acute distress. Eyes: Conjunctivae are normal. PERRL. EOMI. Head: Atraumatic. ENT:      Ears:      Nose: No congestion/rhinnorhea.      Mouth/Throat: Mucous membranes are moist.  Neck: No stridor.  Cardiovascular: Normal rate, regular rhythm.  Good peripheral circulation.  No tenderness to palpation around dentition.  1 cm x 1 cm area of swelling and fluctuance to right bottom jawline with overlying tenderness to palpation. Respiratory: Normal respiratory effort without tachypnea or retractions. Lungs CTAB. Good air entry to the bases with no decreased or absent breath sounds. Musculoskeletal: Full  range of motion to all extremities. No gross deformities appreciated. Neurologic:  Normal speech and language. No gross focal neurologic deficits are appreciated.  Skin:  Skin is warm, dry and intact. No rash noted. Psychiatric: Mood and affect are normal. Speech and behavior are normal. Patient exhibits appropriate insight and judgement.   ____________________________________________   LABS (all labs ordered are listed, but only abnormal results are displayed)  Labs Reviewed - No data to display ____________________________________________  EKG   ____________________________________________  RADIOLOGY   No results  found.  ____________________________________________    PROCEDURES  Procedure(s) performed:    Procedures  INCISION AND DRAINAGE Performed by: Laban Emperor Consent: Verbal consent obtained. Risks and benefits: risks, benefits and alternatives were discussed Type: abscess  Body area: jaw  Anesthesia: local infiltration  Incision was made with a scalpel.  Local anesthetic: lidocaine 1% without epinephrine  Anesthetic total: 2 ml  Complexity: complex Blunt dissection to break up loculations  Drainage: purulent  Drainage amount: moderate  Packing material: 1/4 in iodoform gauze  Patient tolerance: Patient tolerated the procedure well with no immediate complications.    Medications  lidocaine (PF) (XYLOCAINE) 1 % injection 5 mL (5 mLs Intradermal Given 11/27/18 0744)  clindamycin (CLEOCIN) capsule 300 mg (300 mg Oral Given 11/27/18 0744)     ____________________________________________   INITIAL IMPRESSION / ASSESSMENT AND PLAN / ED COURSE  Pertinent labs & imaging results that were available during my care of the patient were reviewed by me and considered in my medical decision making (see chart for details).  Review of the [place] CSRS was performed in accordance of the [place] prior to dispensing any controlled drugs.   Patient's diagnosis is consistent with abscess and dental infection. Abscess was successfully drained in the ED. Patient will be discharged home with prescriptions for clindamycin. Patient is to follow up with dentist as directed. Patient is given ED precautions to return to the ED for any worsening or new symptoms.   Scott Johnston was evaluated in Emergency Department on 11/27/2018 for the symptoms described in the history of present illness. He was evaluated in the context of the global COVID-19 pandemic, which necessitated consideration that the patient might be at risk for infection with the SARS-CoV-2 virus that causes COVID-19. Institutional  protocols and algorithms that pertain to the evaluation of patients at risk for COVID-19 are in a state of rapid change based on information released by regulatory bodies including the CDC and federal and state organizations. These policies and algorithms were followed during the patient's care in the ED.  ____________________________________________  FINAL CLINICAL IMPRESSION(S) / ED DIAGNOSES  Final diagnoses:  Dental abscess      NEW MEDICATIONS STARTED DURING THIS VISIT:  ED Discharge Orders         Ordered    clindamycin (CLEOCIN) 300 MG capsule  3 times daily     11/27/18 0738              This chart was dictated using voice recognition software/Dragon. Despite best efforts to proofread, errors can occur which can change the meaning. Any change was purely unintentional.    Laban Emperor, PA-C 11/27/18 1459    Nena Polio, MD 11/27/18 [phone]

## 2018-12-22 ENCOUNTER — Other Ambulatory Visit: Payer: Self-pay | Admitting: Physician Assistant

## 2018-12-23 ENCOUNTER — Encounter: Payer: Self-pay | Admitting: Physician Assistant

## 2018-12-23 NOTE — Progress Notes (Signed)
Received refill request for patient re:  Acyclovir prescription.  Per chart in [facility], patient with last visit 07/2016, and had refills OKed 08/16/2017 x2 with note that patient needed to have a face to face visit.  Due to COVID, will OK refills of Acyclovir 400mg  #62 1 po BID daily x 2.

## 2019-04-13 DIAGNOSIS — Z8619 Personal history of other infectious and parasitic diseases: Secondary | ICD-10-CM

## 2019-04-14 ENCOUNTER — Other Ambulatory Visit: Payer: Self-pay

## 2019-04-14 ENCOUNTER — Ambulatory Visit: Payer: BC Managed Care – PPO | Admitting: Physician Assistant

## 2019-04-14 ENCOUNTER — Encounter: Payer: Self-pay | Admitting: Physician Assistant

## 2019-04-14 DIAGNOSIS — N341 Nonspecific urethritis: Secondary | ICD-10-CM

## 2019-04-14 DIAGNOSIS — B009 Herpesviral infection, unspecified: Secondary | ICD-10-CM

## 2019-04-14 DIAGNOSIS — Z113 Encounter for screening for infections with a predominantly sexual mode of transmission: Secondary | ICD-10-CM

## 2019-04-14 LAB — GRAM STAIN

## 2019-04-14 MED ORDER — METRONIDAZOLE 500 MG PO TABS: 2000.0000 mg | ORAL_TABLET | Freq: Once | ORAL | 0 refills | Status: AC

## 2019-04-14 MED ORDER — AZITHROMYCIN 500 MG PO TABS: 500.0000 mg | ORAL_TABLET | Freq: Once | ORAL | Status: DC

## 2019-04-14 MED ORDER — ACYCLOVIR 800 MG PO TABS: 800.0000 mg | ORAL_TABLET | Freq: Every day | ORAL | 12 refills | Status: AC

## 2019-04-14 MED ORDER — AZITHROMYCIN 500 MG PO TABS
1000.0000 mg | ORAL_TABLET | Freq: Once | ORAL | Status: AC
  Administered 2019-04-14: 1000 mg via ORAL

## 2019-04-14 NOTE — Progress Notes (Signed)
Gram stain reviewed with provider and is negative today. Pt treated with Azithromycin 1g po DOT and received Metronidazole 2g, and written rx by Sadie Haber, PA for Acyclovir per Sadie Haber, PA verbal and written orders. Provider orders completed.Lyman Speller, RN

## 2019-04-15 NOTE — Progress Notes (Signed)
[facility] Department STI clinic/screening visit  Subjective:  Scott Johnston is a 34 y.o. male being seen today for an STI screening visit. The patient reports they do have symptoms.    Patient has the following medical conditions:   Patient Active Problem List   Diagnosis Date Noted  . History of herpes genitalis 07/24/2016     No chief complaint on file.   HPI  Patient reports that he ran out of his last refill for treatment of HSV and requests new Rx. Reports that he has had a tingling sensation in his urethra and "sticky" urethra for about 1 week.  Denies chronic conditions, current medications and history of any surgery.   See flowsheet for further details and programmatic requirements.    The following portions of the patient's history were reviewed and updated as appropriate: allergies, current medications, past medical history, past social history, past surgical history and problem list.  Objective:  There were no vitals filed for this visit.  Physical Exam Constitutional:      General: He is not in acute distress.    Appearance: Normal appearance. He is normal weight.  HENT:     Head: Normocephalic and atraumatic.     Comments: No nits, lice, or hair loss. No cervical, supraclavicular or axillary adenopathy.    Mouth/Throat:     Mouth: Mucous membranes are moist.     Pharynx: Oropharynx is clear. No oropharyngeal exudate or posterior oropharyngeal erythema.  Eyes:     Conjunctiva/sclera: Conjunctivae normal.  Pulmonary:     Effort: Pulmonary effort is normal.  Abdominal:     Palpations: Abdomen is soft. There is no mass.     Tenderness: There is no abdominal tenderness. There is no guarding or rebound.  Genitourinary:    Penis: Normal.      Testes: Normal.     Comments: Pubic area without nits, lice, edema, erythema, lesions and inguinal adenopathy. Penis circumcised without rash or lesions.  Dried discharge surrounding urethral  opening. Musculoskeletal:     Cervical back: Neck supple. No tenderness.  Skin:    General: Skin is warm and dry.     Findings: No bruising, erythema, lesion or rash.  Neurological:     Mental Status: He is alert and oriented to person, place, and time.  Psychiatric:        Mood and Affect: Mood normal.        Behavior: Behavior normal.        Thought Content: Thought content normal.        Judgment: Judgment normal.       Assessment and Plan:  Scott Johnston is a 34 y.o. male presenting to the [facility] Department for STI screening  1. Screening for STD (sexually transmitted disease) Patient into clinic with symptoms.  Declines blood work today. Rec condoms with all sex. Await test results.  Counseled that RN will call if needs to RTC for further treatment once results are back.  - Gram stain - Gonococcus culture  2. NGU (nongonococcal urethritis) Treat for NGU with Azithromycin 1g po DOT and Metronidazole 2 g po with food, no EtOH for 24 hr before and until 72 hr after completing medicine. No sex for 7 days and until after partner has completed treatment. RTC for re-treatment if vomits <2 hr after taking either medicine. - metroNIDAZOLE (FLAGYL) 500 MG tablet; Take 4 tablets (2,000 mg total) by mouth once for 1 dose.  Dispense: 4 tablet; Refill: 0 -  azithromycin (ZITHROMAX) tablet 1,000 mg  3. HSV-2 infection Per patient request, Rx written and given to him for Acyclovir 800mg  #31 1 po daily with refills for 1 year for suppressive treatment of HSV. - acyclovir (ZOVIRAX) 800 MG tablet; Take 1 tablet (800 mg total) by mouth daily.  Dispense: 31 tablet; Refill: 12     No follow-ups on file.  No future appointments.  Jerene Dilling, PA

## 2019-04-18 LAB — GONOCOCCUS CULTURE

## 2019-05-05 ENCOUNTER — Encounter: Payer: Self-pay | Admitting: Physician Assistant

## 2019-05-05 ENCOUNTER — Other Ambulatory Visit: Payer: Self-pay | Admitting: Physician Assistant

## 2019-05-05 DIAGNOSIS — B009 Herpesviral infection, unspecified: Secondary | ICD-10-CM

## 2019-05-05 NOTE — Progress Notes (Signed)
Received refill request for patient from [facility] in [place] for Acyclovir.  Reviewed chart and Rx handwritten on 04/14/2019, for Acyclovir 800mg  #30 1po daily with refills for 1 year.  Call to pharmacy and per pharmacy personnel, they do have copy of handwritten Rx with refills for 1 year and states that he will correct in their system.

## 2019-10-19 ENCOUNTER — Other Ambulatory Visit: Payer: Self-pay | Admitting: Physician Assistant

## 2019-10-19 ENCOUNTER — Other Ambulatory Visit: Payer: Self-pay

## 2019-10-19 ENCOUNTER — Ambulatory Visit (INDEPENDENT_AMBULATORY_CARE_PROVIDER_SITE_OTHER): Payer: 59

## 2019-10-19 ENCOUNTER — Encounter: Payer: Self-pay | Admitting: Emergency Medicine

## 2019-10-19 ENCOUNTER — Ambulatory Visit
Admission: EM | Admit: 2019-10-19 | Discharge: 2019-10-19 | Payer: 59 | Attending: Physician Assistant | Admitting: Physician Assistant

## 2019-10-19 DIAGNOSIS — M25572 Pain in left ankle and joints of left foot: Secondary | ICD-10-CM | POA: Diagnosis not present

## 2019-10-19 DIAGNOSIS — S82832A Other fracture of upper and lower end of left fibula, initial encounter for closed fracture: Secondary | ICD-10-CM

## 2019-10-19 MED ORDER — IBUPROFEN 800 MG PO TABS: 800.0000 mg | ORAL_TABLET | Freq: Three times a day (TID) | ORAL | 0 refills | Status: AC

## 2019-10-19 NOTE — Discharge Instructions (Signed)
Your ankle bone is fractured today.  You should wear the boot and treat it like a cast.  Use crutches.  Do not bear any weight.  You can ice the ankle and keep it elevated.  Tylenol and Motrin for pain relief.  Follow-up with orthopedics over the next couple of days.  You will need to continue to follow-up with orthopedics until your fracture heals, which might be 4 to 6 weeks or longer.  You have a condition requiring you to follow up with Orthopedics so please call one of the following office for appointment:   [facility] [address] [place], [place] [address] Phone: [phone]  [facility] [address] [place], [place] [address] Phone: [phone]

## 2019-10-19 NOTE — ED Triage Notes (Signed)
Patient c/o left ankle after falling into a hole in his yard last night. He is c/o left ankle pain and swelling.

## 2019-10-19 NOTE — ED Provider Notes (Signed)
[facility]    CSN: [id number] Arrival date & time: 10/19/19  0834      History   Chief Complaint Chief Complaint  Patient presents with  . Ankle Pain  . Ankle Injury    HPI Scott Johnston is a 34 y.o. male.   34 year old male presents for left ankle pain following injury last night.  He says that his dog stick holes in his yard and he accidentally fell into one of the holes and twisted his ankle.  He admits to pain on weightbearing.  He has diffuse swelling of the ankle.  He has taken Tylenol and Motrin for pain with little bit of relief.  He has not iced the ankle.  He denies any previous history of fracture or serious injury to the ankle.  He denies any numbness or tingling.  No other injuries or concerns today.     History reviewed. No pertinent past medical history.  Patient Active Problem List   Diagnosis Date Noted  . History of herpes genitalis 07/24/2016    Past Surgical History:  Procedure Laterality Date  . HERNIA REPAIR         Home Medications    Prior to Admission medications   Medication Sig Start Date End Date Taking? Authorizing Provider  acetaminophen (TYLENOL) 500 MG tablet Take 2 tablets (1,000 mg total) by mouth once. 09/10/14   Betancourt, Jarold Song, NP  acyclovir (ZOVIRAX) 400 MG tablet TAKE 1 TABLET BY MOUTH TWICE A DAY 12/23/18   Matt Holmes, PA  ibuprofen (ADVIL) 800 MG tablet Take 1 tablet (800 mg total) by mouth 3 (three) times daily for 10 days. 10/19/19 10/29/19  Shirlee Latch, PA-C    Family History Family History  Problem Relation Age of Onset  . Healthy Mother   . Healthy Father     Social History Social History   Tobacco Use  . Smoking status: Current Every Day Smoker    Packs/day: 0.50    Years: 14.00    Pack years: 7.00    Types: Cigarettes  . Smokeless tobacco: Never Used  Substance Use Topics  . Alcohol use: Yes    Comment: occ  . Drug use: Never     Allergies   Patient has no known  allergies.   Review of Systems Review of Systems  Constitutional: Negative for fatigue and fever.  Musculoskeletal: Positive for arthralgias, gait problem and joint swelling.  Skin: Negative for color change and wound.  Neurological: Positive for weakness (of ankle). Negative for numbness.  Hematological: Does not bruise/bleed easily.     Physical Exam Triage Vital Signs ED Triage Vitals  Enc Vitals Group     BP 10/19/19 0848 126/78     Pulse Rate 10/19/19 0848 90     Resp 10/19/19 0848 18     Temp 10/19/19 0848 98.3 F (36.8 C)     Temp Source 10/19/19 0848 Oral     SpO2 10/19/19 0848 98 %     Weight 10/19/19 0846 130 lb (59 kg)     Height 10/19/19 0846 5\' 8"  (1.727 m)     Head Circumference --      Peak Flow --      Pain Score 10/19/19 0846 8     Pain Loc --      Pain Edu? --      Excl. in GC? --    No data found.  Updated Vital Signs BP 126/78 (BP Location: Right Arm)  Pulse 90   Temp 98.3 F (36.8 C) (Oral)   Resp 18   Ht 5\' 8"  (1.727 m)   Wt 130 lb (59 kg)   SpO2 98%   BMI 19.77 kg/m    Physical Exam Vitals and nursing note reviewed.  Constitutional:      General: He is not in acute distress.    Appearance: Normal appearance. He is well-developed and normal weight. He is not ill-appearing or toxic-appearing.  HENT:     Head: Normocephalic and atraumatic.  Eyes:     General: No scleral icterus.    Conjunctiva/sclera: Conjunctivae normal.  Cardiovascular:     Rate and Rhythm: Normal rate.     Pulses: Normal pulses.  Pulmonary:     Effort: Pulmonary effort is normal. No respiratory distress.  Musculoskeletal:     Cervical back: Neck supple.     Right ankle: Normal.     Left ankle: Swelling (diffuse swelling of entire ankle) present. No ecchymosis. Tenderness present over the lateral malleolus, medial malleolus and ATF ligament. Decreased range of motion. Normal pulse.  Skin:    General: Skin is warm and dry.  Neurological:     General: No focal  deficit present.     Mental Status: He is alert. Mental status is at baseline.     Motor: No weakness.     Gait: Gait abnormal.  Psychiatric:        Mood and Affect: Mood normal.        Behavior: Behavior normal.        Thought Content: Thought content normal.      UC Treatments / Results  Labs (all labs ordered are listed, but only abnormal results are displayed) Labs Reviewed - No data to display  EKG   Radiology DG Ankle Complete Left  Result Date: 10/19/2019 CLINICAL DATA:  Recent fall with ankle pain laterally, initial encounter EXAM: LEFT ANKLE COMPLETE - 3+ VIEW COMPARISON:  None. FINDINGS: Oblique fracture through the distal fibular metaphysis is noted. Mild posterior displacement of the distal fracture fragment is seen. Generalized soft tissue swelling is noted. No other focal abnormality is seen. IMPRESSION: Oblique distal fibular fracture with soft tissue swelling. Electronically Signed   By: 12/19/2019 M.D.   On: 10/19/2019 09:10    Procedures Procedures (including critical care time)  Medications Ordered in UC Medications - No data to display  Initial Impression / Assessment and Plan / UC Course  I have reviewed the triage vital signs and the nursing notes.  Pertinent labs & imaging results that were available during my care of the patient were reviewed by me and considered in my medical decision making (see chart for details).    X-ray shows oblique fracture of distal fibula.  Patient placed in CAM boot and given crutches.  Discussed care fracture and to follow-up with orthopedics.  Final Clinical Impressions(s) / UC Diagnoses   Final diagnoses:  Other closed fracture of distal end of left fibula, initial encounter  Acute left ankle pain     Discharge Instructions     Your ankle bone is fractured today.  You should wear the boot and treat it like a cast.  Use crutches.  Do not bear any weight.  You can ice the ankle and keep it elevated.  Tylenol and  Motrin for pain relief.  Follow-up with orthopedics over the next couple of days.  You will need to continue to follow-up with orthopedics until your fracture heals, which might  be 4 to 6 weeks or longer.  You have a condition requiring you to follow up with Orthopedics so please call one of the following office for appointment:   [facility] [address] [place], [place] [address] Phone: [phone]  [facility] [address], [place], [place] [address] Phone: [phone]     ED Prescriptions    Medication Sig Dispense Auth. Provider   ibuprofen (ADVIL) 800 MG tablet Take 1 tablet (800 mg total) by mouth 3 (three) times daily for 10 days. 30 tablet Gareth Morgan     PDMP not reviewed this encounter.   Shirlee Latch, PA-C 10/19/19 [phone]

## 2021-11-05 ENCOUNTER — Encounter: Payer: Self-pay | Admitting: Nurse Practitioner

## 2021-11-05 ENCOUNTER — Ambulatory Visit: Payer: Self-pay | Admitting: Nurse Practitioner

## 2021-11-05 DIAGNOSIS — A630 Anogenital (venereal) warts: Secondary | ICD-10-CM

## 2021-11-05 DIAGNOSIS — Z113 Encounter for screening for infections with a predominantly sexual mode of transmission: Secondary | ICD-10-CM

## 2021-11-05 DIAGNOSIS — Z8619 Personal history of other infectious and parasitic diseases: Secondary | ICD-10-CM

## 2021-11-05 LAB — HM HIV SCREENING LAB: HM HIV Screening: NEGATIVE

## 2021-11-05 LAB — HM HEPATITIS C SCREENING LAB: HM Hepatitis Screen: NEGATIVE

## 2021-11-05 LAB — HEPATITIS B SURFACE ANTIGEN: Hepatitis B Surface Ag: NONREACTIVE

## 2021-11-05 MED ORDER — ACYCLOVIR 800 MG PO TABS: 800.0000 mg | ORAL_TABLET | Freq: Every day | ORAL | 11 refills | Status: DC

## 2021-11-05 MED ORDER — IMIQUIMOD 5 % EX CREA: TOPICAL_CREAM | CUTANEOUS | 0 refills | Status: AC

## 2021-11-05 NOTE — Progress Notes (Signed)
Pt is here for STD screening.  Condoms declined.  Scott Johnston M Sahra Converse, RN  

## 2021-11-05 NOTE — Progress Notes (Signed)
[facility] Department STI clinic/screening visit  Subjective:  Scott Johnston is a 36 y.o. male being seen today for an STI screening visit. The patient reports they do have symptoms.    Patient has the following medical conditions:   Patient Active Problem List   Diagnosis Date Noted   History of herpes genitalis 07/24/2016     Chief Complaint  Patient presents with   SEXUALLY TRANSMITTED DISEASE    Screening and refill patient states he has a rash on his penis     HPI  Patient reports to clinic today for STD screening. Patient reports 2 weeks ago he began to have some genital itching and noted a tiny bumps on the shaft of his penis. Patient states he would also like a refill on his Acyclovir.    Does the patient or their partner desires a pregnancy in the next year? No  Screening for MPX risk: Does the patient have an unexplained rash? No Is the patient MSM? No Does the patient endorse multiple sex partners or anonymous sex partners? No Did the patient have close or sexual contact with a person diagnosed with MPX? No Has the patient traveled outside the [place] where MPX is endemic? No Is there a high clinical suspicion for MPX-- evidenced by one of the following No  -Unlikely to be chickenpox  -Lymphadenopathy  -Rash that present in same phase of evolution on any given body part   See flowsheet for further details and programmatic requirements.   Immunization History  Administered Date(s) Administered   MMR 10/21/1987, 11/08/1990     The following portions of the patient's history were reviewed and updated as appropriate: allergies, current medications, past medical history, past social history, past surgical history and problem list.  Objective:  There were no vitals filed for this visit.  Physical Exam Constitutional:      Appearance: Normal appearance.  HENT:     Head: Normocephalic. No abrasion, masses or laceration. Hair is normal.     Right Ear:  External ear normal.     Left Ear: External ear normal.     Nose: Nose normal.     Mouth/Throat:     Lips: Pink.     Mouth: Mucous membranes are moist. No oral lesions.     Pharynx: No pharyngeal swelling, oropharyngeal exudate, posterior oropharyngeal erythema or uvula swelling.     Tonsils: No tonsillar exudate or tonsillar abscesses.     Comments: Poor dentition  Eyes:     General: Lids are normal.        Right eye: No discharge.        Left eye: No discharge.     Conjunctiva/sclera: Conjunctivae normal.     Right eye: No exudate.    Left eye: No exudate. Abdominal:     General: Abdomen is flat.     Palpations: Abdomen is soft.     Tenderness: There is no abdominal tenderness. There is no rebound.  Genitourinary:    Pubic Area: No rash or pubic lice.      Penis: Normal and circumcised. No erythema or discharge.      Testes: Normal.        Right: Mass or tenderness not present.        Left: Mass or tenderness not present.     Rectum: Normal.     Comments: Discharge amount: none Color: none  Tiny hyperpigmented non-ulcerated papules noted to shaft of penis.  Musculoskeletal:     Cervical  back: Full passive range of motion without pain, normal range of motion and neck supple.  Lymphadenopathy:     Cervical: No cervical adenopathy.     Right cervical: No superficial, deep or posterior cervical adenopathy.    Left cervical: No superficial, deep or posterior cervical adenopathy.     Upper Body:     Right upper body: No supraclavicular, axillary or epitrochlear adenopathy.     Left upper body: No supraclavicular, axillary or epitrochlear adenopathy.     Lower Body: No right inguinal adenopathy. No left inguinal adenopathy.  Skin:    General: Skin is warm and dry.     Findings: No lesion or rash.  Neurological:     Mental Status: He is alert and oriented to person, place, and time.  Psychiatric:        Attention and Perception: Attention normal.        Mood and Affect: Mood  normal.        Speech: Speech normal.        Behavior: Behavior normal. Behavior is cooperative.       Assessment and Plan:  Scott Johnston is a 36 y.o. male presenting to the [facility] for STI screening  1. Screening for venereal disease -36 year old male in clinic today for STD screening. -Patient does have STI symptoms Patient accepted all screenings including oral GC, urine CT/GC and bloodwork for HIV/RPR.  Patient meets criteria for HepB screening? Yes. Ordered? Yes Patient meets criteria for HepC screening? Yes. Ordered? Yes Recommended condom use with all sex Discussed importance of condom use for STI prevent  Discussed time line for [facility] results and that patient will be called with positive results and encouraged patient to call if he had not heard in 2 weeks Recommended returning for continued or worsening symptoms.    - Chlamydia/GC NAA, Confirmation - Gonococcus culture - HIV/HCV [facility] - Syphilis Serology, [facility] - HBV Antigen/Antibody [facility]   2. 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 Prescription submitted to pharmacy for cream.   - imiquimod (ALDARA) 5 % cream; Apply topically 3 (three) times a week.  Dispense: 12 each; Refill: 0  3. History of herpes genitalis -Refill on Acyclovir submitted to pharmacy.   - acyclovir (ZOVIRAX) 800 MG tablet; Take 1 tablet (800 mg total) by mouth daily.  Dispense: 30 tablet; Refill: 11   Total time spent: 30 minutes   Return if symptoms worsen or fail to improve.    Gregary Cromer, FNP

## 2021-11-09 LAB — GONOCOCCUS CULTURE

## 2021-11-09 LAB — CHLAMYDIA/GC NAA, CONFIRMATION
Chlamydia trachomatis, NAA: NEGATIVE
Neisseria gonorrhoeae, NAA: NEGATIVE

## 2022-03-21 IMAGING — CR DG ANKLE COMPLETE 3+V*L*
3 series · 3 of 3 positions shown · non-contrast
Comparison: None.

CLINICAL DATA: Recent fall with ankle pain laterally, initial
encounter

EXAM:
LEFT ANKLE COMPLETE - 3+ VIEW

[ankle ap]
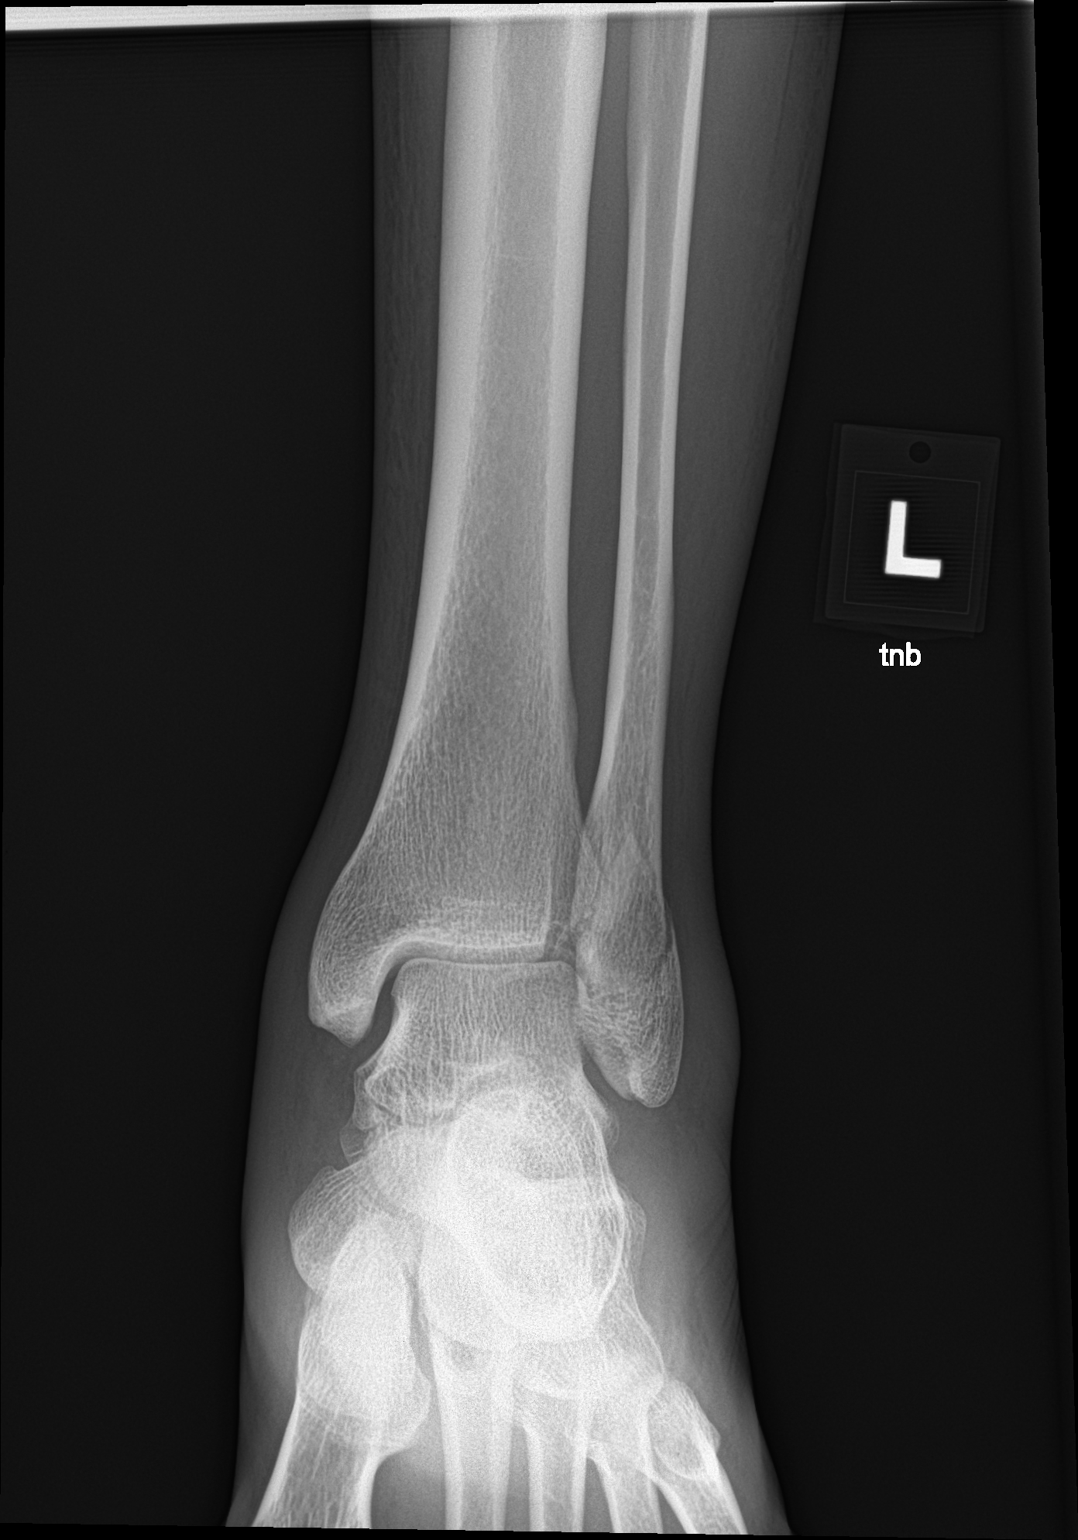

[ankle obl]
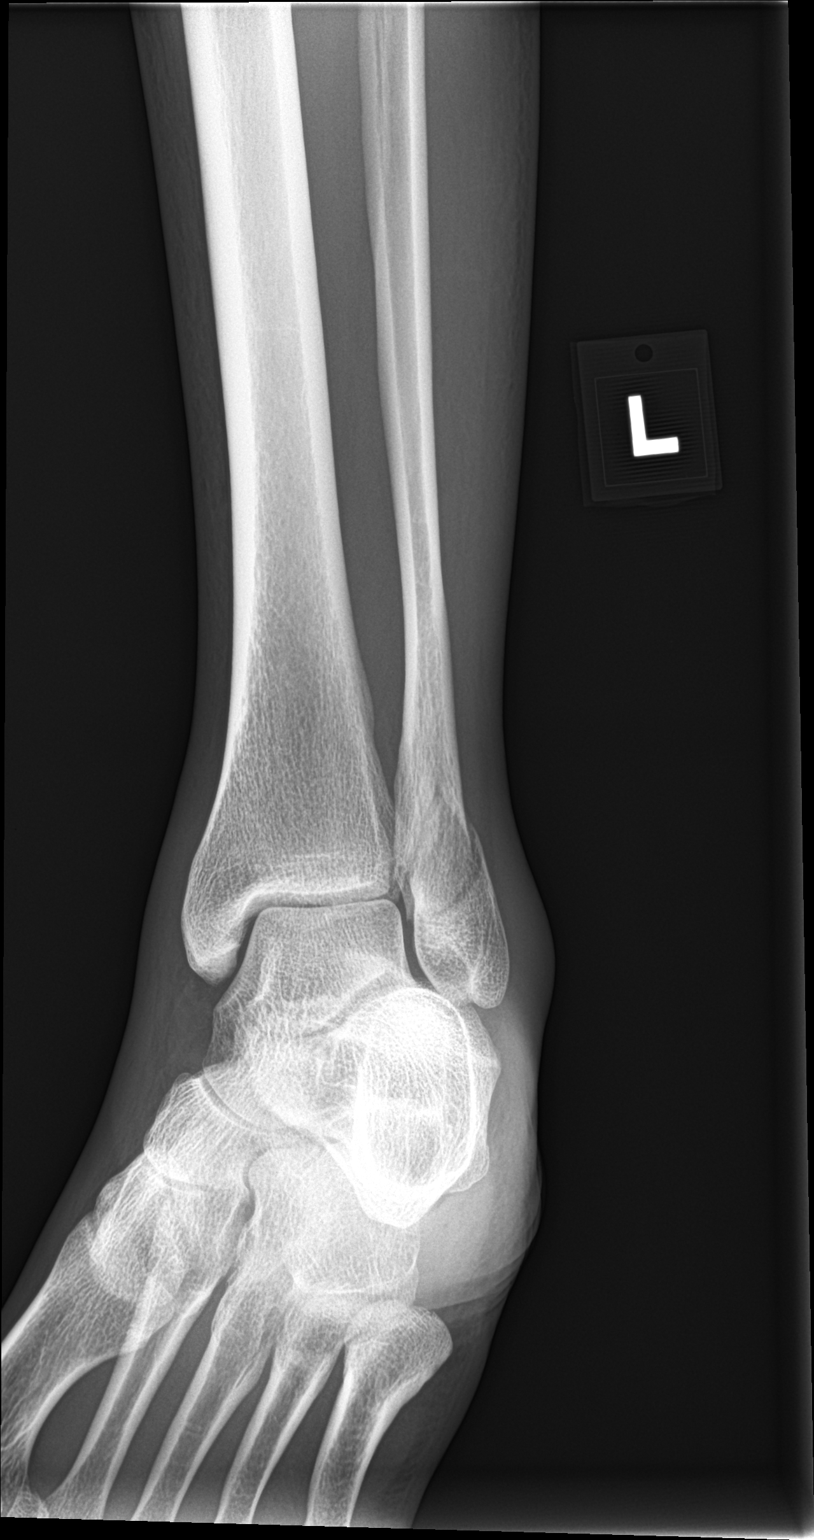

[ankle lat]
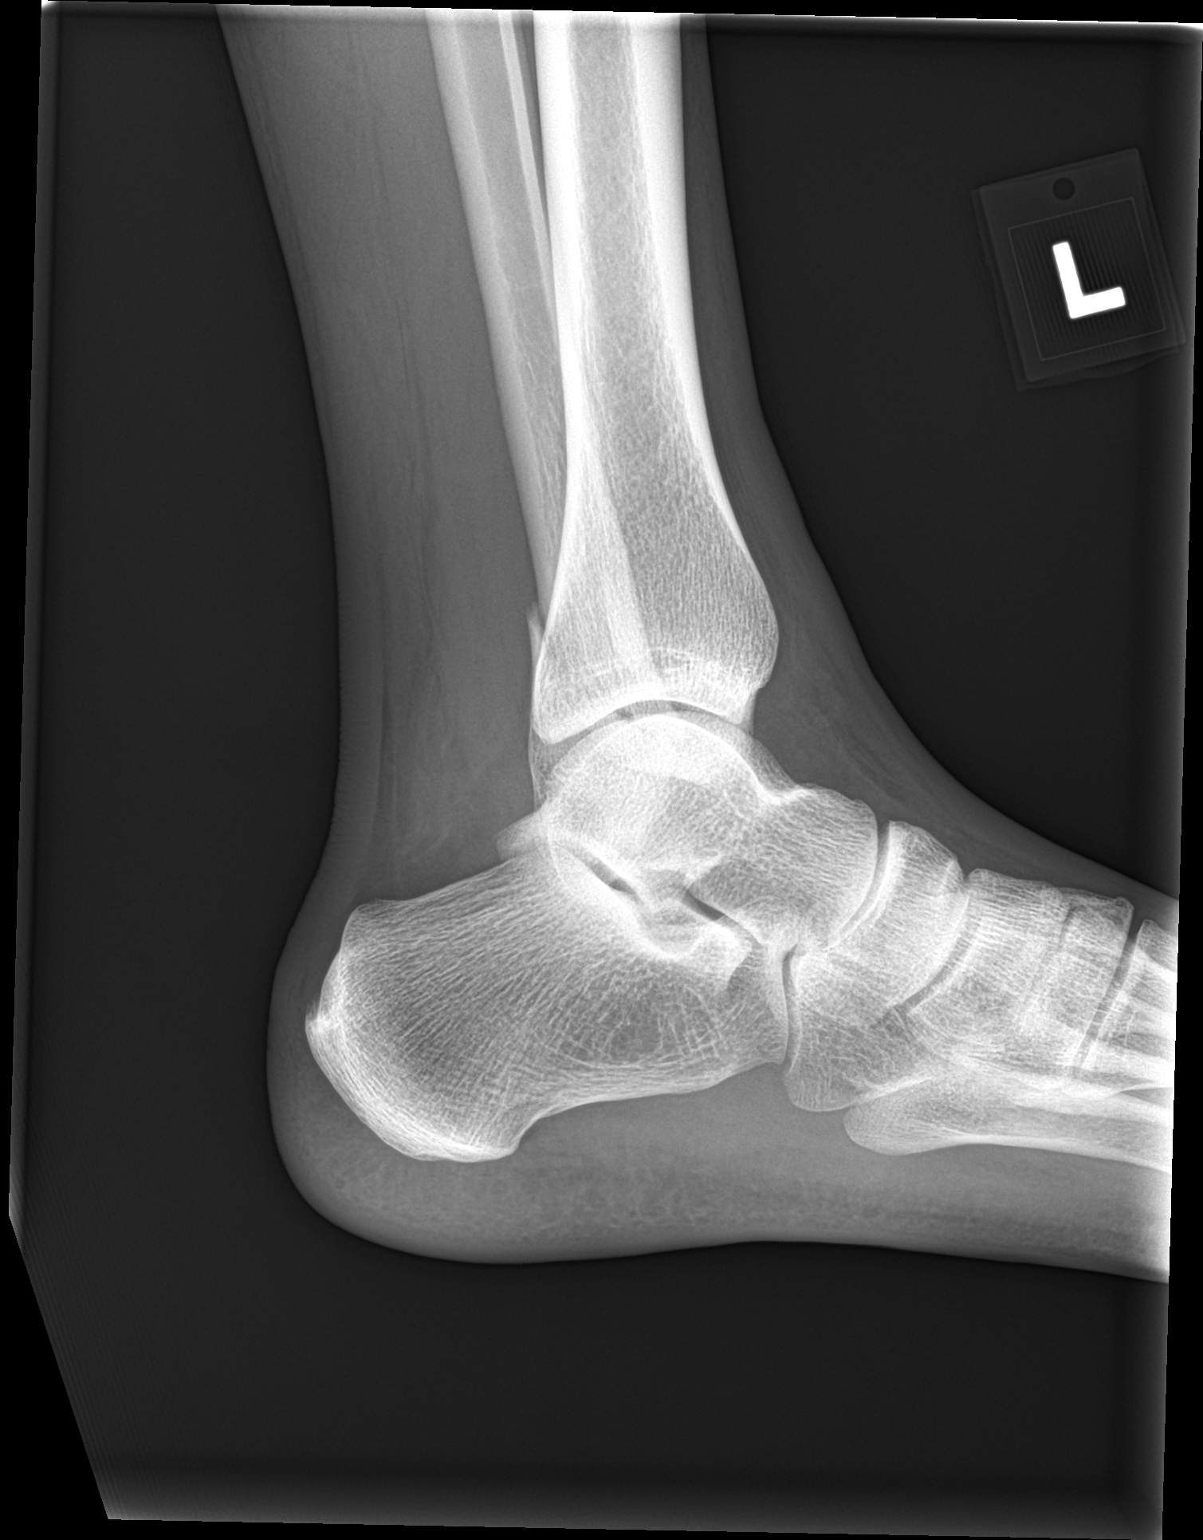

[3 of 3 positions shown; findings below may reference images not displayed]

FINDINGS: Oblique fracture through the distal fibular metaphysis is noted.
Mild posterior displacement of the distal fracture fragment is seen.
Generalized soft tissue swelling is noted. No other focal
abnormality is seen.
IMPRESSION: Oblique distal fibular fracture with soft tissue swelling.

## 2024-02-10 ENCOUNTER — Encounter: Payer: Self-pay | Admitting: Emergency Medicine

## 2024-02-10 ENCOUNTER — Ambulatory Visit
Admission: EM | Admit: 2024-02-10 | Discharge: 2024-02-10 | Disposition: A | Discharge status: Home / Self Care | Source: Home / Self Care

## 2024-02-10 DIAGNOSIS — A6000 Herpesviral infection of urogenital system, unspecified: Secondary | ICD-10-CM

## 2024-02-10 DIAGNOSIS — K047 Periapical abscess without sinus: Secondary | ICD-10-CM

## 2024-02-10 MED ORDER — ACYCLOVIR 800 MG PO TABS: 800.0000 mg | ORAL_TABLET | Freq: Two times a day (BID) | ORAL | 1 refills | Status: AC

## 2024-02-10 MED ORDER — AMOXICILLIN-POT CLAVULANATE 875-125 MG PO TABS: 1.0000 | ORAL_TABLET | Freq: Two times a day (BID) | ORAL | 0 refills | Status: AC

## 2024-02-10 NOTE — Discharge Instructions (Addendum)
 Start twice daily for 7 days to treat your dental infection.  You may use Tylenol  or ibuprofen  over-the-counter as needed.  Please note this is a temporary solution to your symptoms and you need to see a dentist as soon as possible for further treatment.  I have also refilled your acyclovir .  Please go to the emergency room if you develop any worsening symptoms.  Hope you feel better soon!

## 2024-02-10 NOTE — ED Provider Notes (Signed)
 " [facility]    CSN: [id number] Arrival date & time: 02/10/24  1433      History   Chief Complaint Chief Complaint  Patient presents with   Dental Pain   Medication Refill    HPI Scott Johnston is a 39 y.o. male presents for dental pain.  Patient states he has a broken tooth to the lower left back molar for some time.  Past couple days he had some pain and today woke up with worsening pain and swelling of the gumline.  States his face feels swollen as well.  No fevers or chills.  He has been taking OTC analgesics for pain.  In addition he would like a refill of his acyclovir  as he does have a history of genital herpes.  He does not currently have a dentist.  No other concerns at this time.   Dental Pain Medication Refill   History reviewed. No pertinent past medical history.  Patient Active Problem List   Diagnosis Date Noted   History of herpes genitalis 07/24/2016    Past Surgical History:  Procedure Laterality Date   HERNIA REPAIR         Home Medications    Prior to Admission medications  Medication Sig Start Date End Date Taking? Authorizing Provider  acyclovir  (ZOVIRAX ) 800 MG tablet Take 1 tablet (800 mg total) by mouth 2 (two) times daily for 5 days. 02/10/24 02/15/24 Yes Briseis Aguilera, Jodi R, NP  amoxicillin -clavulanate (AUGMENTIN) 875-125 MG tablet Take 1 tablet by mouth every 12 (twelve) hours. 02/10/24  Yes Marsa Matteo, Jodi R, NP  acetaminophen  (TYLENOL ) 500 MG tablet Take 2 tablets (1,000 mg total) by mouth once. 09/10/14   Betancourt, Ellouise LABOR, NP  imiquimod  (ALDARA ) 5 % cream Apply topically 3 (three) times a week. 11/05/21   Teresa Bosworth, NP    Family History Family History  Problem Relation Age of Onset   Healthy Mother    Healthy Father     Social History Social History[1]   Allergies   Patient has no known allergies.   Review of Systems Review of Systems  HENT:  Positive for dental problem.      Physical Exam Triage Vital Signs ED Triage  Vitals  Encounter Vitals Group     BP 02/10/24 1532 139/81     Girls Systolic BP Percentile --      Girls Diastolic BP Percentile --      Boys Systolic BP Percentile --      Boys Diastolic BP Percentile --      Pulse Rate 02/10/24 1532 79     Resp 02/10/24 1532 16     Temp 02/10/24 1532 98.6 F (37 C)     Temp Source 02/10/24 1532 Oral     SpO2 02/10/24 1532 98 %     Weight 02/10/24 1530 132 lb (59.9 kg)     Height --      Head Circumference --      Peak Flow --      Pain Score 02/10/24 1530 0     Pain Loc --      Pain Education --      Exclude from Growth Chart --    No data found.  Updated Vital Signs BP 139/81 (BP Location: Right Arm)   Pulse 79   Temp 98.6 F (37 C) (Oral)   Resp 16   Wt 132 lb (59.9 kg)   SpO2 98%   BMI 20.07 kg/m   Visual Acuity  Right Eye Distance:   Left Eye Distance:   Bilateral Distance:    Right Eye Near:   Left Eye Near:    Bilateral Near:     Physical Exam Vitals and nursing note reviewed.  Constitutional:      General: He is not in acute distress.    Appearance: Normal appearance. He is not ill-appearing.  HENT:     Head: Normocephalic and atraumatic.     Mouth/Throat:      Comments: Left lower back molar is partially broken.  There is swelling of the gum with no palpable or visible abscess.  Area is tender to palpation.  No active drainage.  No facial swelling. Eyes:     Pupils: Pupils are equal, round, and reactive to light.  Cardiovascular:     Rate and Rhythm: Normal rate.  Pulmonary:     Effort: Pulmonary effort is normal.  Skin:    General: Skin is warm and dry.  Neurological:     General: No focal deficit present.     Mental Status: He is alert and oriented to person, place, and time.  Psychiatric:        Mood and Affect: Mood normal.        Behavior: Behavior normal.      UC Treatments / Results  Labs (all labs ordered are listed, but only abnormal results are displayed) Labs Reviewed - No data to  display  EKG   Radiology No results found.  Procedures Procedures (including critical care time)  Medications Ordered in UC Medications - No data to display  Initial Impression / Assessment and Plan / UC Course  I have reviewed the triage vital signs and the nursing notes.  Pertinent labs & imaging results that were available during my care of the patient were reviewed by me and considered in my medical decision making (see chart for details).     Will start Augmentin for dental infection.  Continue OTC analgesics as needed.  Did instruct patient he will need to see a dentist for further treatment as antibiotics are only temporary solution to his symptoms and he verbalized understanding.  Did refill his acyclovir .  ER precautions reviewed. Final Clinical Impressions(s) / UC Diagnoses   Final diagnoses:  Dental infection  Genital herpes simplex, unspecified site     Discharge Instructions      Start twice daily for 7 days to treat your dental infection.  You may use Tylenol  or ibuprofen  over-the-counter as needed.  Please note this is a temporary solution to your symptoms and you need to see a dentist as soon as possible for further treatment.  I have also refilled your acyclovir .  Please go to the emergency room if you develop any worsening symptoms.  Hope you feel better soon!    ED Prescriptions     Medication Sig Dispense Auth. Provider   amoxicillin -clavulanate (AUGMENTIN) 875-125 MG tablet Take 1 tablet by mouth every 12 (twelve) hours. 14 tablet Shamari Lofquist, Jodi R, NP   acyclovir  (ZOVIRAX ) 800 MG tablet Take 1 tablet (800 mg total) by mouth 2 (two) times daily for 5 days. 10 tablet Angelisse Riso, Jodi R, NP      PDMP not reviewed this encounter.    [1]  Social History Tobacco Use   Smoking status: Every Day    Current packs/day: 0.50    Average packs/day: 0.5 packs/day for 14.0 years (7.0 ttl pk-yrs)    Types: Cigarettes   Smokeless tobacco: Never  Vaping Use  Vaping status: Never Used  Substance Use Topics   Alcohol use: Yes    Comment: 40 oz beer every day   Drug use: Not Currently    Types: Marijuana    Comment: once every three days     Loreda Myla SAUNDERS, NP 02/10/24 1547  "

## 2024-02-10 NOTE — ED Triage Notes (Addendum)
 Pt presents with left side dental pain that started yesterday. He is developing an abscess on his gums and his jaw was swollen this morning. He has taken OTC pain medication for relief.   Pt would also like a refill on his acyclovir .
